# Patient Record
Sex: Female | Born: 1955
Health system: Southern US, Community
[De-identification: ages and names within clinical notes are randomized; demographics above are authoritative.]

## PROBLEM LIST (undated history)

## (undated) DIAGNOSIS — J301 Allergic rhinitis due to pollen: Secondary | ICD-10-CM

## (undated) DIAGNOSIS — Z8371 Family history of colonic polyps: Secondary | ICD-10-CM

## (undated) DIAGNOSIS — R51 Headache: Secondary | ICD-10-CM

## (undated) DIAGNOSIS — N390 Urinary tract infection, site not specified: Secondary | ICD-10-CM

## (undated) DIAGNOSIS — K219 Gastro-esophageal reflux disease without esophagitis: Secondary | ICD-10-CM

## (undated) DIAGNOSIS — Z83719 Family history of colon polyps, unspecified: Secondary | ICD-10-CM

## (undated) DIAGNOSIS — R519 Headache, unspecified: Secondary | ICD-10-CM

## (undated) DIAGNOSIS — D219 Benign neoplasm of connective and other soft tissue, unspecified: Secondary | ICD-10-CM

## (undated) HISTORY — DX: Benign neoplasm of connective and other soft tissue, unspecified: D21.9

## (undated) HISTORY — DX: Headache, unspecified: R51.9

## (undated) HISTORY — DX: Urinary tract infection, site not specified: N39.0

## (undated) HISTORY — DX: Family history of colonic polyps: Z83.71

## (undated) HISTORY — DX: Allergic rhinitis due to pollen: J30.1

## (undated) HISTORY — PX: OTHER SURGICAL HISTORY: SHX169

## (undated) HISTORY — DX: Gastro-esophageal reflux disease without esophagitis: K21.9

## (undated) HISTORY — DX: Family history of colon polyps, unspecified: Z83.719

## (undated) HISTORY — DX: Headache: R51

---

## 2016-08-17 DIAGNOSIS — Z1231 Encounter for screening mammogram for malignant neoplasm of breast: Secondary | ICD-10-CM | POA: Diagnosis not present

## 2017-11-13 ENCOUNTER — Ambulatory Visit: Payer: BLUE CROSS/BLUE SHIELD | Admitting: Family Medicine

## 2017-11-13 ENCOUNTER — Encounter: Payer: Self-pay | Admitting: Family Medicine

## 2017-11-13 VITALS — BP 130/72 | HR 85 | Temp 98.0°F | Resp 12 | Ht 67.5 in | Wt 155.4 lb

## 2017-11-13 DIAGNOSIS — N3281 Overactive bladder: Secondary | ICD-10-CM

## 2017-11-13 DIAGNOSIS — I10 Essential (primary) hypertension: Secondary | ICD-10-CM | POA: Diagnosis not present

## 2017-11-13 DIAGNOSIS — R350 Frequency of micturition: Secondary | ICD-10-CM | POA: Diagnosis not present

## 2017-11-13 LAB — URINALYSIS, ROUTINE W REFLEX MICROSCOPIC
Bilirubin Urine: NEGATIVE
Ketones, ur: NEGATIVE
Nitrite: POSITIVE — AB
RBC / HPF: NONE SEEN (ref 0–?)
Specific Gravity, Urine: <=1.005 — AB (ref 1.000–1.030)
Total Protein, Urine: NEGATIVE
Urine Glucose: NEGATIVE
Urobilinogen, UA: 0.2 (ref 0.0–1.0)
pH: 6.5 (ref 5.0–8.0)

## 2017-11-13 MED ORDER — MIRABEGRON ER 25 MG PO TB24: 25.0000 mg | ORAL_TABLET | Freq: Every day | ORAL | 1 refills | Status: DC

## 2017-11-13 NOTE — Patient Instructions (Signed)
A few things to remember from today's visit:   Urinary frequency - Plan: Urinalysis, Routine w reflex microscopic  Overactive bladder   Overactive Bladder, Adult Overactive bladder is a group of urinary symptoms. With overactive bladder, you may suddenly feel the need to pass urine (urinate) right away. After feeling this sudden urge, you might also leak urine if you cannot get to the bathroom fast enough (urinary incontinence). These symptoms might interfere with your daily work or social activities. Overactive bladder symptoms may also wake you up at night. Overactive bladder affects the nerve signals between your bladder and your brain. Your bladder may get the signal to empty before it is full. Very sensitive muscles can also make your bladder squeeze too soon. What are the causes? Many things can cause an overactive bladder. Possible causes include:  Urinary tract infection.  Infection of nearby tissues, such as the prostate.  Prostate enlargement.  Being pregnant with twins or more (multiples).  Surgery on the uterus or urethra.  Bladder stones, inflammation, or tumors.  Drinking too much caffeine or alcohol.  Certain medicines, especially those that you take to help your body get rid of extra fluid (diuretics) by increasing urine production.  Muscle or nerve weakness, especially from: ? A spinal cord injury. ? Stroke. ? Multiple sclerosis. ? Parkinson disease.  Diabetes. This can cause a high urine volume that fills the bladder so quickly that the normal urge to urinate is triggered very strongly.  Constipation. A buildup of too much stool can put pressure on your bladder.  What increases the risk? You may be at greater risk for overactive bladder if you:  Are an older adult.  Smoke.  Are going through menopause.  Have prostate problems.  Have a neurological disease, such as stroke, dementia, Parkinson disease, or multiple sclerosis (MS).  Eat or drink  things that irritate the bladder. These include alcohol, spicy food, and caffeine.  Are overweight or obese.  What are the signs or symptoms? The signs and symptoms of an overactive bladder include:  Sudden, strong urges to urinate.  Leaking urine.  Urinating eight or more times per day.  Waking up to urinate two or more times per night.  How is this diagnosed? Your health care provider may suspect overactive bladder based on your symptoms. The health care provider will do a physical exam and take your medical history. Blood or urine tests may also be done. For example, you might need to have a bladder function test to check how well you can hold your urine. You might also need to see a health care provider who specializes in the urinary tract (urologist). How is this treated? Treatment for overactive bladder depends on the cause of your condition and whether it is mild or severe. Certain treatments can be done in your health care provider's office or clinic. You can also make lifestyle changes at home. Options include: Behavioral Treatments  Biofeedback. A specialist uses sensors to help you become aware of your body's signals.  Keeping a daily log of when you need to urinate and what happens after the urge. This may help you manage your condition.  Bladder training. This helps you learn to control the urge to urinate by following a schedule that directs you to urinate at regular intervals (timed voiding). At first, you might have to wait a few minutes after feeling the urge. In time, you should be able to schedule bathroom visits an hour or more apart.  Kegel exercises.  These are exercises to strengthen the pelvic floor muscles, which support the bladder. Toning these muscles can help you control urination, even if your bladder muscles are overactive. A specialist will teach you how to do these exercises correctly. They require daily practice.  Weight loss. If you are obese or  overweight, losing weight might relieve your symptoms of overactive bladder. Talk to your health care provider about losing weight and whether there is a specific program or method that would work best for you.  Diet change. This might help if constipation is making your overactive bladder worse. Your health care provider or a dietitian can explain ways to change what you eat to ease constipation. You might also need to consume less alcohol and caffeine or drink other fluids at different times of the day.  Stopping smoking.  Wearing pads to absorb leakage while you wait for other treatments to take effect. Physical Treatments  Electrical stimulation. Electrodes send gentle pulses of electricity to strengthen the nerves or muscles that help to control the bladder. Sometimes, the electrodes are placed outside of the body. In other cases, they might be placed inside the body (implanted). This treatment can take several months to have an effect.  Supportive devices. Women may need a plastic device that fits into the vagina and supports the bladder (pessary). Medicines Several medicines can help treat overactive bladder and are usually used along with other treatments. Some are injected into the muscles involved in urination. Others come in pill form. Your health care provider may prescribe:  Antispasmodics. These medicines block the signals that the nerves send to the bladder. This keeps the bladder from releasing urine at the wrong time.  Tricyclic antidepressants. These types of antidepressants also relax bladder muscles.  Surgery  You may have a device implanted to help manage the nerve signals that indicate when you need to urinate.  You may have surgery to implant electrodes for electrical stimulation.  Sometimes, very severe cases of overactive bladder require surgery to change the shape of the bladder. Follow these instructions at home:  Take medicines only as directed by your health  care provider.  Use any implants or a pessary as directed by your health care provider.  Make any diet or lifestyle changes that are recommended by your health care provider. These might include: ? Drinking less fluid or drinking at different times of the day. If you need to urinate often during the night, you may need to stop drinking fluids early in the evening. ? Cutting down on caffeine or alcohol. Both can make an overactive bladder worse. Caffeine is found in coffee, tea, and sodas. ? Doing Kegel exercises to strengthen muscles. ? Losing weight if you need to. ? Eating a healthy and balanced diet to prevent constipation.  Keep a journal or log to track how much and when you drink and also when you feel the need to urinate. This will help your health care provider to monitor your condition. Contact a health care provider if:  Your symptoms do not get better after treatment.  Your pain and discomfort are getting worse.  You have more frequent urges to urinate.  You have a fever. Get help right away if: You are not able to control your bladder at all. This information is not intended to replace advice given to you by your health care provider. Make sure you discuss any questions you have with your health care provider. Document Released: 07/29/2009 Document Revised: 03/09/2016 Document Reviewed: 02/25/2014  Elsevier Interactive Patient Education  [employer]. Please be sure medication list is accurate. If a new problem present, please set up appointment sooner than planned today.

## 2017-11-13 NOTE — Progress Notes (Signed)
HPI:   Tiffany Duran is a 62 y.o. female, who is here today to establish care with me.  Former PCP: Dr [employer].  She moved from [place], [place] a couple years ago. Last preventive routine visit: 2016.  Chronic medical problems: Recurrent UTI,PVC's,HTN, and contact dermatitis among some.  HTN: Denies severe/frequent headache, visual changes, chest pain, dyspnea, palpitation, claudication, focal weakness, or edema. Currently she is on HCTZ 25 mg daily.   Concerns today: Urine odor and frequency.   Concerned about urinary frequency that lasted 4 months. Occasionally dark and odorous urine.  Urgency and occasional incontinence.  She is sexually active.  Denies dysuria, gross hematuria,or decreased urine output. She has not noted vaginal bleeding or discharge. No fever,chills,abdominal pain,or abnormal wt loss.    Review of Systems  Constitutional: Negative for activity change, appetite change, fatigue and fever.  HENT: Negative for mouth sores, nosebleeds and sore throat.   Eyes: Negative for redness and visual disturbance.  Gastrointestinal: Negative for abdominal pain, constipation, diarrhea, nausea and vomiting.  Endocrine: Negative for cold intolerance and heat intolerance.  Genitourinary: Positive for frequency and urgency. Negative for decreased urine volume, dysuria, hematuria, vaginal bleeding and vaginal discharge.  Musculoskeletal: Negative for gait problem and myalgias.  Skin: Negative for rash and wound.  Neurological: Negative for syncope, weakness and headaches.  Hematological: Negative for adenopathy. Does not bruise/bleed easily.  Psychiatric/Behavioral: Negative for confusion and sleep disturbance.      No current outpatient medications on file prior to visit.   No current facility-administered medications on file prior to visit.      Past Medical History:  Diagnosis Date  . Family history of polyps in the colon   . Frequent  headaches   . GERD (gastroesophageal reflux disease)   . Hay fever   . UTI (urinary tract infection)    No Known Allergies  Family History  Problem Relation Age of Onset  . Early death Father   . Hypertension Mother   . Hypertension Brother     Social History   Socioeconomic History  . Marital status: Married    Spouse name: None  . Number of children: None  . Years of education: None  . Highest education level: None  Social Needs  . Financial resource strain: None  . Food insecurity - worry: None  . Food insecurity - inability: None  . Transportation needs - medical: None  . Transportation needs - non-medical: None  Occupational History  . None  Tobacco Use  . Smoking status: Never Smoker  . Smokeless tobacco: Never Used  Substance and Sexual Activity  . Alcohol use: Yes  . Drug use: No  . Sexual activity: None  Other Topics Concern  . None  Social History Narrative  . None    Vitals:   11/13/17 0807  BP: 130/72  Pulse: 85  Resp: 12  Temp: 98 F (36.7 C)  SpO2: 99%    Body mass index is 23.98 kg/m.   Physical Exam  Nursing note and vitals reviewed. Constitutional: She is oriented to person, place, and time. She appears well-developed and well-nourished. No distress.  HENT:  Head: Normocephalic and atraumatic.  Mouth/Throat: Oropharynx is clear and moist and mucous membranes are normal.  Eyes: Conjunctivae are normal. Pupils are equal, round, and reactive to light.  Cardiovascular: Normal rate and regular rhythm.  No murmur heard. Pulses:      Dorsalis pedis pulses are 2+ on the right side,  and 2+ on the left side.  Respiratory: Effort normal and breath sounds normal. No respiratory distress.  GI: Soft. She exhibits no mass. There is no tenderness. There is no CVA tenderness.  Musculoskeletal: She exhibits no edema or tenderness.  Lymphadenopathy:    She has no cervical adenopathy.  Neurological: She is alert and oriented to person, place, and  time. She has normal strength. Gait normal.  Skin: Skin is warm. No rash noted. No erythema.  Psychiatric: Her speech is normal. Her mood appears anxious.  Well groomed,good eye contact.      ASSESSMENT AND PLAN:  Tiffany Duran was seen today for establish care.  Diagnoses and all orders for this visit:  Urinary frequency  Possible etiologies discussed: Overactive bladder,IC,UTI, and other bladder abnormalities among some. Further recommendations will be given according to U/A and Ucx results.  -     Urinalysis, Routine w reflex microscopic -     Culture, Urine  Overactive bladder  After discussion of Sx and treatment options,she agrees with trying Myrbetriq 25 mg,side effects discussed. She will let me know in 6-8 weeks how she is doing with medication. F/U in 4 months.  -     mirabegron ER (MYRBETRIQ) 25 MG TB24 tablet; Take 1 tablet (25 mg total) by mouth daily.  Hypertension, essential, benign  Adequately controlled. No changes in current management. DASH diet recommended. Eye exam recommended annually. F/U in 4 months.       Wahid Holley G. Martinique, MD  [facility]. [place] office.

## 2017-11-16 LAB — URINE CULTURE
MICRO NUMBER:: 90122791
SPECIMEN QUALITY:: ADEQUATE

## 2017-11-20 ENCOUNTER — Other Ambulatory Visit: Payer: Self-pay | Admitting: *Deleted

## 2017-11-20 MED ORDER — NITROFURANTOIN MONOHYD MACRO 100 MG PO CAPS: 100.0000 mg | ORAL_CAPSULE | Freq: Two times a day (BID) | ORAL | 0 refills | Status: AC

## 2018-01-06 ENCOUNTER — Other Ambulatory Visit: Payer: Self-pay | Admitting: Family Medicine

## 2018-01-06 DIAGNOSIS — N3281 Overactive bladder: Secondary | ICD-10-CM

## 2018-01-07 ENCOUNTER — Telehealth: Payer: Self-pay | Admitting: Family Medicine

## 2018-01-07 NOTE — Telephone Encounter (Signed)
Copied from [place] [id number]. Topic: Inquiry >> Jan 07, 2018 11:20 AM Neva Seat wrote: Pt is new to the area and is asking for a recommendation from Dr. Martinique to a Gynecologist. Please call pt w/ doctor recommended.

## 2018-01-07 NOTE — Telephone Encounter (Signed)
Options: Dr Ulanda Edison, and Dr Nelda Marseille.  Thanks, BJ

## 2018-01-08 NOTE — Telephone Encounter (Signed)
Left message to give clinic a call back. 

## 2018-01-08 NOTE — Telephone Encounter (Signed)
Spoke with patient, gave recommendations per Dr. Martinique.

## 2018-01-15 ENCOUNTER — Other Ambulatory Visit: Payer: Self-pay | Admitting: *Deleted

## 2018-01-15 DIAGNOSIS — N3281 Overactive bladder: Secondary | ICD-10-CM

## 2018-01-15 MED ORDER — MIRABEGRON ER 25 MG PO TB24: 25.0000 mg | ORAL_TABLET | Freq: Every day | ORAL | 2 refills | Status: DC

## 2018-01-22 ENCOUNTER — Other Ambulatory Visit (HOSPITAL_COMMUNITY)
Admission: RE | Admit: 2018-01-22 | Discharge: 2018-01-22 | Disposition: A | Discharge status: Home / Self Care | Payer: BLUE CROSS/BLUE SHIELD | Source: Ambulatory Visit | Attending: Obstetrics and Gynecology | Admitting: Obstetrics and Gynecology

## 2018-01-22 ENCOUNTER — Ambulatory Visit (INDEPENDENT_AMBULATORY_CARE_PROVIDER_SITE_OTHER): Payer: BLUE CROSS/BLUE SHIELD | Admitting: Obstetrics and Gynecology

## 2018-01-22 ENCOUNTER — Other Ambulatory Visit: Payer: Self-pay

## 2018-01-22 ENCOUNTER — Encounter: Payer: Self-pay | Admitting: Obstetrics and Gynecology

## 2018-01-22 VITALS — BP 130/82 | HR 80 | Resp 14 | Ht 67.0 in | Wt 158.0 lb

## 2018-01-22 DIAGNOSIS — Z124 Encounter for screening for malignant neoplasm of cervix: Secondary | ICD-10-CM

## 2018-01-22 DIAGNOSIS — Z0000 Encounter for general adult medical examination without abnormal findings: Secondary | ICD-10-CM

## 2018-01-22 DIAGNOSIS — R7309 Other abnormal glucose: Secondary | ICD-10-CM | POA: Diagnosis not present

## 2018-01-22 DIAGNOSIS — Z01419 Encounter for gynecological examination (general) (routine) without abnormal findings: Secondary | ICD-10-CM

## 2018-01-22 DIAGNOSIS — E559 Vitamin D deficiency, unspecified: Secondary | ICD-10-CM

## 2018-01-22 DIAGNOSIS — N952 Postmenopausal atrophic vaginitis: Secondary | ICD-10-CM | POA: Diagnosis not present

## 2018-01-22 DIAGNOSIS — N941 Unspecified dyspareunia: Secondary | ICD-10-CM | POA: Diagnosis not present

## 2018-01-22 NOTE — Patient Instructions (Signed)
EXERCISE AND DIET:  We recommended that you start or continue a regular exercise program for good health. Regular exercise means any activity that makes your heart beat faster and makes you sweat.  We recommend exercising at least 30 minutes per day at least 3 days a week, preferably 4 or 5.  We also recommend a diet low in fat and sugar.  Inactivity, poor dietary choices and obesity can cause diabetes, heart attack, stroke, and kidney damage, among others.    ALCOHOL AND SMOKING:  Women should limit their alcohol intake to no more than 7 drinks/beers/glasses of wine (combined, not each!) per week. Moderation of alcohol intake to this level decreases your risk of breast cancer and liver damage. And of course, no recreational drugs are part of a healthy lifestyle.  And absolutely no smoking or even second hand smoke. Most people know smoking can cause heart and lung diseases, but did you know it also contributes to weakening of your bones? Aging of your skin?  Yellowing of your teeth and nails?  CALCIUM AND VITAMIN D:  Adequate intake of calcium and Vitamin D are recommended.  The recommendations for exact amounts of these supplements seem to change often, but generally speaking 600 mg of calcium (either carbonate or citrate) and 800 units of Vitamin D per day seems prudent. Certain women may benefit from higher intake of Vitamin D.  If you are among these women, your doctor will have told you during your visit.    You should be getting 1,200 mg of calcium a day between your diet and supplements.  PAP SMEARS:  Pap smears, to check for cervical cancer or precancers,  have traditionally been done yearly, although recent scientific advances have shown that most women can have pap smears less often.  However, every woman still should have a physical exam from her gynecologist every year. It will include a breast check, inspection of the vulva and vagina to check for abnormal growths or skin changes, a visual exam  of the cervix, and then an exam to evaluate the size and shape of the uterus and ovaries.  And after 61 years of age, a rectal exam is indicated to check for rectal cancers. We will also provide age appropriate advice regarding health maintenance, like when you should have certain vaccines, screening for sexually transmitted diseases, bone density testing, colonoscopy, mammograms, etc.   MAMMOGRAMS:  All women over 61 years old should have a yearly mammogram. Many facilities now offer a "3D" mammogram, which may cost around $50 extra out of pocket. If possible,  we recommend you accept the option to have the 3D mammogram performed.  It both reduces the number of women who will be called back for extra views which then turn out to be normal, and it is better than the routine mammogram at detecting truly abnormal areas.    COLONOSCOPY:  Colonoscopy to screen for colon cancer is recommended for all women at age 61.  We know, you hate the idea of the prep.  We agree, BUT, having colon cancer and not knowing it is worse!!  Colon cancer so often starts as a polyp that can be seen and removed at colonscopy, which can quite literally save your life!  And if your first colonoscopy is normal and you have no family history of colon cancer, most women don't have to have it again for 10 years.  Once every ten years, you can do something that may end up saving your life,  right?  We will be happy to help you get it scheduled when you are ready.  Be sure to check your insurance coverage so you understand how much it will cost.  It may be covered as a preventative service at no cost, but you should check your particular policy.      Breast Self-Awareness Breast self-awareness means being familiar with how your breasts look and feel. It involves checking your breasts regularly and reporting any changes to your health care provider. Practicing breast self-awareness is important. A change in your breasts can be a sign of a  serious medical problem. Being familiar with how your breasts look and feel allows you to find any problems early, when treatment is more likely to be successful. All women should practice breast self-awareness, including women who have had breast implants. How to do a breast self-exam One way to learn what is normal for your breasts and whether your breasts are changing is to do a breast self-exam. To do a breast self-exam: Look for Changes  1. Remove all the clothing above your waist. 2. Stand in front of a mirror in a room with good lighting. 3. Put your hands on your hips. 4. Push your hands firmly downward. 5. Compare your breasts in the mirror. Look for differences between them (asymmetry), such as: ? Differences in shape. ? Differences in size. ? Puckers, dips, and bumps in one breast and not the other. 6. Look at each breast for changes in your skin, such as: ? Redness. ? Scaly areas. 7. Look for changes in your nipples, such as: ? Discharge. ? Bleeding. ? Dimpling. ? Redness. ? A change in position. Feel for Changes  Carefully feel your breasts for lumps and changes. It is best to do this while lying on your back on the floor and again while sitting or standing in the shower or tub with soapy water on your skin. Feel each breast in the following way:  Place the arm on the side of the breast you are examining above your head.  Feel your breast with the other hand.  Start in the nipple area and make  inch (2 cm) overlapping circles to feel your breast. Use the pads of your three middle fingers to do this. Apply light pressure, then medium pressure, then firm pressure. The light pressure will allow you to feel the tissue closest to the skin. The medium pressure will allow you to feel the tissue that is a little deeper. The firm pressure will allow you to feel the tissue close to the ribs.  Continue the overlapping circles, moving downward over the breast until you feel your  ribs below your breast.  Move one finger-width toward the center of the body. Continue to use the  inch (2 cm) overlapping circles to feel your breast as you move slowly up toward your collarbone.  Continue the up and down exam using all three pressures until you reach your armpit.  Write Down What You Find  Write down what is normal for each breast and any changes that you find. Keep a written record with breast changes or normal findings for each breast. By writing this information down, you do not need to depend only on memory for size, tenderness, or location. Write down where you are in your menstrual cycle, if you are still menstruating. If you are having trouble noticing differences in your breasts, do not get discouraged. With time you will become more familiar with the variations  in your breasts and more comfortable with the exam. How often should I examine my breasts? Examine your breasts every month. If you are breastfeeding, the best time to examine your breasts is after a feeding or after using a breast pump. If you menstruate, the best time to examine your breasts is 5-7 days after your period is over. During your period, your breasts are lumpier, and it may be more difficult to notice changes. When should I see my health care provider? See your health care provider if you notice:  A change in shape or size of your breasts or nipples.  A change in the skin of your breast or nipples, such as a reddened or scaly area.  Unusual discharge from your nipples.  A lump or thick area that was not there before.  Pain in your breasts.  Anything that concerns you.  This information is not intended to replace advice given to you by your health care provider. Make sure you discuss any questions you have with your health care provider. Document Released: 10/02/2005 Document Revised: 03/09/2016 Document Reviewed: 08/22/2015 Elsevier Interactive Patient Education  [employer].

## 2018-01-22 NOTE — Progress Notes (Signed)
62 y.o. [id number] MarriedCaucasianF here for annual exam. No vaginal bleeding. Sexually active, some entry dyspareunia. Just started using lubricant, it is helping some. Intermittent deep dyspareunia.     She just started on myrbetriq for urinary urgency, no leakage. It is helping.     No LMP recorded. Patient is postmenopausal.          Sexually active: Yes.    The current method of family planning is post menopausal status.    Exercising: No.  The patient does not participate in regular exercise at present. Smoker:  no  Health Maintenance: Pap:  2017 WNL per patient  History of abnormal Pap:  no MMG:  08/2016 WNL [facility] (care everywhere)  Colonoscopy:  03/2011 polyps repeat in 10 yrs BMD:   04/2008 Normal TDaP:  12-11-11 Gardasil: N/A   reports that she has never smoked. She has never used smokeless tobacco. She reports that she drinks about 1.2 - 1.8 oz of alcohol per week. She reports that she does not use drugs. Works as an Web designer, works from home.  4 daughters, all out of state. 4 grand children.   Past Medical History:  Diagnosis Date  . Family history of polyps in the colon   . Frequent headaches   . GERD (gastroesophageal reflux disease)   . Hay fever   . UTI (urinary tract infection)     Past Surgical History:  Procedure Laterality Date  .  ablation uterine      Current Outpatient Medications  Medication Sig Dispense Refill  . mirabegron ER (MYRBETRIQ) 25 MG TB24 tablet Take 1 tablet (25 mg total) by mouth daily. 90 tablet 2   No current facility-administered medications for this visit.     Family History  Problem Relation Age of Onset  . Early death Father   . Hypertension Mother   . Hypertension Brother   Father died of bone cancer at 32  Review of Systems  Constitutional: Negative.   HENT: Negative.   Eyes: Negative.   Respiratory: Negative.   Cardiovascular: Negative.   Gastrointestinal: Negative.   Endocrine: Negative.    Genitourinary: Positive for dyspareunia.  Musculoskeletal: Negative.   Skin: Negative.   Allergic/Immunologic: Negative.   Neurological: Negative.   Psychiatric/Behavioral: Negative.     Exam:   BP 130/82 (BP Location: Right Arm, Patient Position: Sitting, Cuff Size: Normal)   Pulse 80   Resp 14   Ht 5\' 7"  (1.702 m)   Wt 158 lb (71.7 kg)   BMI 24.75 kg/m   Weight change: @WEIGHTCHANGE @ Height:   Height: 5\' 7"  (170.2 cm)  Ht Readings from Last 3 Encounters:  01/22/18 5\' 7"  (1.702 m)  11/13/17 5' 7.5" (1.715 m)    General appearance: alert, cooperative and appears stated age Head: Normocephalic, without obvious abnormality, atraumatic Neck: no adenopathy, supple, symmetrical, trachea midline and thyroid normal to inspection and palpation Lungs: clear to auscultation bilaterally Cardiovascular: regular rate and rhythm Breasts: normal appearance, no masses or tenderness Abdomen: soft, non-tender; non distended,  no masses,  no organomegaly Extremities: extremities normal, atraumatic, no cyanosis or edema Skin: Skin color, texture, turgor normal. No rashes or lesions Lymph nodes: Cervical, supraclavicular, and axillary nodes normal. No abnormal inguinal nodes palpated Neurologic: Grossly normal   Pelvic: External genitalia:  no lesions              Urethra:  normal appearing urethra with no masses, tenderness or lesions  Bartholins and Skenes: normal                 Vagina: atrophic appearing vagina with normal color and discharge, no lesions              Cervix: no lesions               Bimanual Exam:  Uterus:  normal size, contour, position, consistency, mobility, non-tender              Adnexa: no mass, fullness, tenderness               Rectovaginal: Confirms               Anus:  normal sphincter tone, no lesions  Chaperone was present for exam.  A:  Well Woman with normal exam  Was being followed by prior MD for borderline BP  Vit d def in her  diet  Mild dyspareunia, just started using a lubricant  Vaginal atrophy  P:   Pap with hpv  Discussed breast self exam  Discussed calcium and vit D intake  Vit D  Screening labs (fasting)  Mammogram  Colonoscopy UTD  We discussed the option of vaginal estrogen, she will call if she wants to try it

## 2018-01-23 ENCOUNTER — Telehealth: Payer: Self-pay | Admitting: *Deleted

## 2018-01-23 LAB — COMPREHENSIVE METABOLIC PANEL
ALT: 13 IU/L (ref 0–32)
AST: 15 IU/L (ref 0–40)
Albumin/Globulin Ratio: 1.9 (ref 1.2–2.2)
Albumin: 4.6 g/dL (ref 3.6–4.8)
Alkaline Phosphatase: 80 IU/L (ref 39–117)
BUN/Creatinine Ratio: 16 (ref 12–28)
BUN: 12 mg/dL (ref 8–27)
Bilirubin Total: 0.5 mg/dL (ref 0.0–1.2)
CO2: 28 mmol/L (ref 20–29)
Calcium: 9.7 mg/dL (ref 8.7–10.3)
Chloride: 99 mmol/L (ref 96–106)
Creatinine, Ser: 0.74 mg/dL (ref 0.57–1.00)
GFR calc Af Amer: 102 mL/min/{1.73_m2} (ref >59)
GFR calc non Af Amer: 88 mL/min/{1.73_m2} (ref >59)
Globulin, Total: 2.4 g/dL (ref 1.5–4.5)
Glucose: 100 mg/dL — ABNORMAL HIGH (ref 65–99)
Potassium: 4.2 mmol/L (ref 3.5–5.2)
Sodium: 142 mmol/L (ref 134–144)
Total Protein: 7 g/dL (ref 6.0–8.5)

## 2018-01-23 LAB — LIPID PANEL
Chol/HDL Ratio: 3.9 ratio (ref 0.0–4.4)
Cholesterol, Total: 225 mg/dL — ABNORMAL HIGH (ref 100–199)
HDL: 57 mg/dL (ref >39)
LDL Calculated: 142 mg/dL — ABNORMAL HIGH (ref 0–99)
Triglycerides: 128 mg/dL (ref 0–149)
VLDL Cholesterol Cal: 26 mg/dL (ref 5–40)

## 2018-01-23 LAB — CBC
Hematocrit: 42.2 % (ref 34.0–46.6)
Hemoglobin: 13.8 g/dL (ref 11.1–15.9)
MCH: 29.1 pg (ref 26.6–33.0)
MCHC: 32.7 g/dL (ref 31.5–35.7)
MCV: 89 fL (ref 79–97)
Platelets: 233 10*3/uL (ref 150–379)
RBC: 4.74 x10E6/uL (ref 3.77–5.28)
RDW: 13 % (ref 12.3–15.4)
WBC: 6.4 10*3/uL (ref 3.4–10.8)

## 2018-01-23 LAB — CYTOLOGY - PAP
Diagnosis: NEGATIVE
HPV: NOT DETECTED

## 2018-01-23 LAB — VITAMIN D 25 HYDROXY (VIT D DEFICIENCY, FRACTURES): Vit D, 25-Hydroxy: 12.3 ng/mL — ABNORMAL LOW (ref 30.0–100.0)

## 2018-01-23 MED ORDER — VITAMIN D (ERGOCALCIFEROL) 1.25 MG (50000 UNIT) PO CAPS: 50000.0000 [IU] | ORAL_CAPSULE | ORAL | 0 refills | Status: DC

## 2018-01-23 NOTE — Telephone Encounter (Signed)
Returned patients call- left another message to call back -eh

## 2018-01-23 NOTE — Telephone Encounter (Signed)
Return call to [place].

## 2018-01-23 NOTE — Telephone Encounter (Signed)
Left message to call for lab results-eh

## 2018-01-23 NOTE — Telephone Encounter (Signed)
Spoke with patient and gave results and recommendations. Added hgbA1c. RX for vitamin D sent in and scheduled for 12 week recheck -eh

## 2018-01-23 NOTE — Telephone Encounter (Signed)
-----   Message from Salvadore Dom, MD sent at 01/23/2018 12:49 PM EDT ----- Her glucose was just over normal. Please add a hgbA1c onto her blood work from yesterday. Her vit d was very low. Please call in 50,000 IU of vit d 3, 1 po qweek x 12 weeks, then she should have her vit d rechecked. Her total cholesterol and LDL were slightly elevated. The rest of her lipid panel was normal.  I would recommend a mediterranean diet and regular exercise.  A mediterranean diet is high in fruits, vegetables, whole grains, fish, chicken, nuts, healthy fats (olive oil or canola oil). Low fat dairy. Limit butter, margarine, red meat and sweets.  The rest of her lab work was normal. 02 recall

## 2018-01-23 NOTE — Telephone Encounter (Signed)
Return call to [place].

## 2018-02-01 LAB — SPECIMEN STATUS REPORT

## 2018-02-01 LAB — HGB A1C W/O EAG: Hgb A1c MFr Bld: 5.6 % (ref 4.8–5.6)

## 2018-03-08 ENCOUNTER — Other Ambulatory Visit: Payer: Self-pay | Admitting: Family Medicine

## 2018-03-08 DIAGNOSIS — Z1231 Encounter for screening mammogram for malignant neoplasm of breast: Secondary | ICD-10-CM

## 2018-03-12 ENCOUNTER — Ambulatory Visit: Payer: BLUE CROSS/BLUE SHIELD | Admitting: Family Medicine

## 2018-03-12 ENCOUNTER — Encounter: Payer: Self-pay | Admitting: Family Medicine

## 2018-03-12 VITALS — BP 124/83 | HR 72 | Temp 97.9°F | Resp 12 | Ht 67.0 in | Wt 163.2 lb

## 2018-03-12 DIAGNOSIS — I10 Essential (primary) hypertension: Secondary | ICD-10-CM | POA: Diagnosis not present

## 2018-03-12 DIAGNOSIS — N3281 Overactive bladder: Secondary | ICD-10-CM | POA: Diagnosis not present

## 2018-03-12 DIAGNOSIS — E785 Hyperlipidemia, unspecified: Secondary | ICD-10-CM

## 2018-03-12 MED ORDER — MIRABEGRON ER 50 MG PO TB24
50.0000 mg | ORAL_TABLET | Freq: Every day | ORAL | 3 refills | Status: DC
Start: 2018-03-12 — End: 2019-03-14

## 2018-03-12 NOTE — Progress Notes (Signed)
HPI:   Tiffany Duran is a 62 y.o. female, who is here today for 4 months follow up.   She was last seen on 11/13/2017, when she was complaining of urinary frequency, urgency and occasional urine incontinence. Ucx grew E. coli, she completed treatment with Macrobid.  Myrbetriq 25 mg was started last visit, she has tolerated medication well and symptoms have improved but not resolved.    Since her last OV she has seen her gyn,had labs done in 01/2018.  Diagnosed with vitamin D deficiency, currently she is on ergocalciferol 50,000 units weekly.   Hypertension:   Currently on non pharmacologic treatment.  She was prescribed HCTZ by former PCP,did not take it.  She is doing very well low-salt diet.   Negative for unusual headache, visual changes, exertional chest pain, dyspnea,  focal weakness, or edema.   Lab Results  Component Value Date   CREATININE 0.74 01/22/2018   BUN 12 01/22/2018   NA 142 01/22/2018   K 4.2 01/22/2018   CL 99 01/22/2018   CO2 28 01/22/2018     HLD: She is on nonpharmacologic treatment.  She has not been on medication before.  Lab Results  Component Value Date   CHOL 225 (H) 01/22/2018   HDL 57 01/22/2018   LDLCALC 142 (H) 01/22/2018   TRIG 128 01/22/2018   CHOLHDL 3.9 01/22/2018     Review of Systems  Constitutional: Negative for activity change, appetite change, fatigue and fever.  HENT: Negative for mouth sores, nosebleeds and trouble swallowing.   Eyes: Negative for redness and visual disturbance.  Respiratory: Negative for cough, shortness of breath and wheezing.   Cardiovascular: Negative for chest pain, palpitations and leg swelling.  Gastrointestinal: Negative for abdominal pain, nausea and vomiting.       Negative for changes in bowel habits.  Genitourinary: Positive for frequency. Negative for decreased urine volume, dysuria and hematuria.  Musculoskeletal: Negative for gait problem and myalgias.    Neurological: Negative for syncope, weakness and headaches.      Current Outpatient Medications on File Prior to Visit  Medication Sig Dispense Refill  . Vitamin D, Ergocalciferol, (DRISDOL) 50000 units CAPS capsule Take 1 capsule (50,000 Units total) by mouth every 7 (seven) days. 12 capsule 0   No current facility-administered medications on file prior to visit.      Past Medical History:  Diagnosis Date  . Family history of polyps in the colon   . Fibroid   . Frequent headaches   . GERD (gastroesophageal reflux disease)   . Hay fever   . UTI (urinary tract infection)    Allergies  Allergen Reactions  . Latex Anxiety and Shortness Of Breath  . Amoxicillin Hives and Itching    Social History   Socioeconomic History  . Marital status: Married    Spouse name: Not on file  . Number of children: Not on file  . Years of education: Not on file  . Highest education level: Not on file  Occupational History  . Not on file  Social Needs  . Financial resource strain: Not on file  . Food insecurity:    Worry: Not on file    Inability: Not on file  . Transportation needs:    Medical: Not on file    Non-medical: Not on file  Tobacco Use  . Smoking status: Never Smoker  . Smokeless tobacco: Never Used  Substance and Sexual Activity  . Alcohol use: Yes  Alcohol/week: 1.2 - 1.8 oz    Types: 2 - 3 Standard drinks or equivalent per week  . Drug use: No  . Sexual activity: Yes    Partners: Male    Birth control/protection: Post-menopausal  Lifestyle  . Physical activity:    Days per week: Not on file    Minutes per session: Not on file  . Stress: Not on file  Relationships  . Social connections:    Talks on phone: Not on file    Gets together: Not on file    Attends religious service: Not on file    Active member of club or organization: Not on file    Attends meetings of clubs or organizations: Not on file    Relationship status: Not on file  Other Topics  Concern  . Not on file  Social History Narrative  . Not on file    Vitals:   03/12/18 0951  BP: 124/83  Pulse: 72  Resp: 12  Temp: 97.9 F (36.6 C)  SpO2: 97%   Body mass index is 25.57 kg/m.    Physical Exam  Nursing note and vitals reviewed. Constitutional: She is oriented to person, place, and time. She appears well-developed and well-nourished. No distress.  HENT:  Head: Normocephalic and atraumatic.  Mouth/Throat: Oropharynx is clear and moist and mucous membranes are normal.  Eyes: Pupils are equal, round, and reactive to light. Conjunctivae are normal.  Cardiovascular: Normal rate and regular rhythm.  No murmur heard. Pulses:      Dorsalis pedis pulses are 2+ on the right side, and 2+ on the left side.  Respiratory: Effort normal and breath sounds normal. No respiratory distress.  GI: Soft. She exhibits no mass. There is no hepatomegaly. There is no tenderness.  Musculoskeletal: She exhibits no edema or tenderness.  Lymphadenopathy:    She has no cervical adenopathy.  Neurological: She is alert and oriented to person, place, and time. She has normal strength. Gait normal.  Skin: Skin is warm. No erythema.  Psychiatric: She has a normal mood and affect.  Well groomed, good eye contact.     ASSESSMENT AND PLAN:   Ms. Mardel Grudzien Baudoin was seen today for 4 months follow-up.  No orders of the defined types were placed in this encounter.   Overactive bladder Symptoms improved, tolerating Myrbetriq 25 mg well. She agrees with increasing dose of Myrbetriq from 25 mg to 50 mg.   Anson Crofts and pelvic floor exercises recommended. She can continue following annually.  Hyperlipidemia 10 years ASCVD risk 3.7%, we discussed interpretation and current recommendations in regard to pharmacologic treatment. She will continue nonpharmacologic treatment. She can follow annually.  Hypertension, essential, benign BP adequately controlled today. We discussed current  recommendations in regard to hypertension treatment. Low-salt diet. Recommend monitoring BP at home regularly. She will continue nonpharmacologic treatment and can continue following annually, before if needed.    The 10-year ASCVD risk score Mikey Bussing DC Brooke Bonito., et al., 2013) is: 3.7%   Values used to calculate the score:     Age: 69 years     Sex: Female     Is Non-Hispanic African American: No     Diabetic: No     Tobacco smoker: No     Systolic Blood Pressure: 161 mmHg     Is BP treated: No     HDL Cholesterol: 57 mg/dL     Total Cholesterol: 225 mg/dL     Tallan Sandoz G. Martinique, MD  [facility].  [place] office.

## 2018-03-12 NOTE — Assessment & Plan Note (Signed)
10 years ASCVD risk 3.7%, we discussed interpretation and current recommendations in regard to pharmacologic treatment. She will continue nonpharmacologic treatment. She can follow annually.

## 2018-03-12 NOTE — Patient Instructions (Addendum)
A few things to remember from today's visit:   Hypertension, essential, benign  Overactive bladder - Plan: mirabegron ER (MYRBETRIQ) 50 MG TB24 tablet  Hyperlipidemia, unspecified hyperlipidemia type  Continue monitoring blood pressure at home. Low salt diet. Myrbetriq dose increased to 50 mg.  This exercise helps with mild urine leakage associated with cough, laughing, or sneezing. It may help with other types of urine incontinence and even with mild fecal incontinence.   Tighten and relax the pelvic muscles intermittently during the day. Once you are familiar with exercise try to hold pelvic muscles contraction for about 8-10 seconds.in the beginning you may not be able to hold contraction for more than a second or 2 but eventually you will be able to hold contraction harder and for longer time. Perform  8-12 exercises 3 times per day and daily for 15-20 weeks. You will need to continue exercises indefinitely to have a lasting effect.      Low fat diet.    The 10-year ASCVD risk score Mikey Bussing DC Brooke Bonito., et al., 2013) is: 3.7%   Values used to calculate the score:     Age: 62 years     Sex: Female     Is Non-Hispanic African American: No     Diabetic: No     Tobacco smoker: No     Systolic Blood Pressure: 098 mmHg     Is BP treated: No     HDL Cholesterol: 57 mg/dL     Total Cholesterol: 225 mg/dL   Please be sure medication list is accurate. If a new problem present, please set up appointment sooner than planned today.

## 2018-03-12 NOTE — Assessment & Plan Note (Signed)
Symptoms improved, tolerating Myrbetriq 25 mg well. She agrees with increasing dose of Myrbetriq from 25 mg to 50 mg.   Anson Crofts and pelvic floor exercises recommended. She can continue following annually.

## 2018-03-12 NOTE — Assessment & Plan Note (Signed)
BP adequately controlled today. We discussed current recommendations in regard to hypertension treatment. Low-salt diet. Recommend monitoring BP at home regularly. She will continue nonpharmacologic treatment and can continue following annually, before if needed.

## 2018-03-13 ENCOUNTER — Encounter: Payer: Self-pay | Admitting: Family Medicine

## 2018-03-29 ENCOUNTER — Ambulatory Visit
Admission: RE | Admit: 2018-03-29 | Discharge: 2018-03-29 | Disposition: A | Discharge status: Home / Self Care | Payer: BLUE CROSS/BLUE SHIELD | Source: Ambulatory Visit | Attending: Family Medicine | Admitting: Family Medicine

## 2018-03-29 DIAGNOSIS — Z1231 Encounter for screening mammogram for malignant neoplasm of breast: Secondary | ICD-10-CM | POA: Diagnosis not present

## 2018-04-08 DIAGNOSIS — M2022 Hallux rigidus, left foot: Secondary | ICD-10-CM | POA: Diagnosis not present

## 2018-04-08 DIAGNOSIS — M79672 Pain in left foot: Secondary | ICD-10-CM | POA: Diagnosis not present

## 2018-04-08 DIAGNOSIS — S90122A Contusion of left lesser toe(s) without damage to nail, initial encounter: Secondary | ICD-10-CM | POA: Diagnosis not present

## 2018-04-10 ENCOUNTER — Other Ambulatory Visit (INDEPENDENT_AMBULATORY_CARE_PROVIDER_SITE_OTHER): Payer: BLUE CROSS/BLUE SHIELD

## 2018-04-10 DIAGNOSIS — E559 Vitamin D deficiency, unspecified: Secondary | ICD-10-CM | POA: Diagnosis not present

## 2018-04-11 LAB — VITAMIN D 25 HYDROXY (VIT D DEFICIENCY, FRACTURES): Vit D, 25-Hydroxy: 31.7 ng/mL (ref 30.0–100.0)

## 2018-05-03 ENCOUNTER — Other Ambulatory Visit: Payer: Self-pay | Admitting: Obstetrics and Gynecology

## 2018-05-03 NOTE — Telephone Encounter (Signed)
She was advised via mychart to start taking 1,000 IU a day of vit D.  Refill not refilled.

## 2018-05-03 NOTE — Telephone Encounter (Signed)
Medication refill request: Vitamin D 50,000 Last AEX: 01/22/18 Next GRM:[id number] scheduled at this time  Last MMG (if hormonal medication request):  03/29/18 Birads category 1 neg Refill authorized: please refill if appropriate

## 2018-05-14 ENCOUNTER — Other Ambulatory Visit: Payer: Self-pay

## 2018-06-10 ENCOUNTER — Encounter (HOSPITAL_BASED_OUTPATIENT_CLINIC_OR_DEPARTMENT_OTHER): Payer: Self-pay

## 2018-06-10 ENCOUNTER — Ambulatory Visit (HOSPITAL_BASED_OUTPATIENT_CLINIC_OR_DEPARTMENT_OTHER): Admit: 2018-06-10 | Payer: BLUE CROSS/BLUE SHIELD | Admitting: Podiatry

## 2018-06-10 SURGERY — FUSION, INVOLVING TARSAL BONE
Anesthesia: General | Laterality: Left

## 2018-08-26 DIAGNOSIS — D225 Melanocytic nevi of trunk: Secondary | ICD-10-CM | POA: Diagnosis not present

## 2018-08-26 DIAGNOSIS — L814 Other melanin hyperpigmentation: Secondary | ICD-10-CM | POA: Diagnosis not present

## 2018-08-26 DIAGNOSIS — D2372 Other benign neoplasm of skin of left lower limb, including hip: Secondary | ICD-10-CM | POA: Diagnosis not present

## 2018-09-30 ENCOUNTER — Ambulatory Visit: Payer: BLUE CROSS/BLUE SHIELD | Admitting: Family Medicine

## 2018-09-30 DIAGNOSIS — Z23 Encounter for immunization: Secondary | ICD-10-CM | POA: Diagnosis not present

## 2018-09-30 DIAGNOSIS — L309 Dermatitis, unspecified: Secondary | ICD-10-CM | POA: Diagnosis not present

## 2019-03-14 ENCOUNTER — Telehealth: Payer: Self-pay | Admitting: Family Medicine

## 2019-03-14 ENCOUNTER — Ambulatory Visit (INDEPENDENT_AMBULATORY_CARE_PROVIDER_SITE_OTHER): Payer: BLUE CROSS/BLUE SHIELD | Admitting: Family Medicine

## 2019-03-14 ENCOUNTER — Encounter: Payer: Self-pay | Admitting: Family Medicine

## 2019-03-14 ENCOUNTER — Other Ambulatory Visit: Payer: Self-pay

## 2019-03-14 VITALS — BP 145/85 | HR 67 | Temp 97.9°F | Resp 12 | Ht 67.0 in | Wt 172.5 lb

## 2019-03-14 DIAGNOSIS — E559 Vitamin D deficiency, unspecified: Secondary | ICD-10-CM | POA: Diagnosis not present

## 2019-03-14 DIAGNOSIS — Z1329 Encounter for screening for other suspected endocrine disorder: Secondary | ICD-10-CM

## 2019-03-14 DIAGNOSIS — I10 Essential (primary) hypertension: Secondary | ICD-10-CM | POA: Diagnosis not present

## 2019-03-14 DIAGNOSIS — E785 Hyperlipidemia, unspecified: Secondary | ICD-10-CM | POA: Diagnosis not present

## 2019-03-14 DIAGNOSIS — Z13228 Encounter for screening for other metabolic disorders: Secondary | ICD-10-CM

## 2019-03-14 DIAGNOSIS — Z0000 Encounter for general adult medical examination without abnormal findings: Secondary | ICD-10-CM

## 2019-03-14 DIAGNOSIS — R059 Cough, unspecified: Secondary | ICD-10-CM

## 2019-03-14 DIAGNOSIS — R05 Cough: Secondary | ICD-10-CM

## 2019-03-14 DIAGNOSIS — Z0001 Encounter for general adult medical examination with abnormal findings: Secondary | ICD-10-CM | POA: Diagnosis not present

## 2019-03-14 DIAGNOSIS — Z130 Encounter for screening for diseases of the blood and blood-forming organs and certain disorders involving the immune mechanism: Secondary | ICD-10-CM | POA: Diagnosis not present

## 2019-03-14 DIAGNOSIS — K219 Gastro-esophageal reflux disease without esophagitis: Secondary | ICD-10-CM | POA: Insufficient documentation

## 2019-03-14 LAB — LIPID PANEL
Cholesterol: 234 mg/dL — ABNORMAL HIGH (ref 0–200)
HDL: 53.7 mg/dL (ref >39.00)
LDL Cholesterol: 163 mg/dL — ABNORMAL HIGH (ref 0–99)
NonHDL: 180.08
Total CHOL/HDL Ratio: 4
Triglycerides: 87 mg/dL (ref 0.0–149.0)
VLDL: 17.4 mg/dL (ref 0.0–40.0)

## 2019-03-14 LAB — COMPREHENSIVE METABOLIC PANEL
ALT: 12 U/L (ref 0–35)
AST: 13 U/L (ref 0–37)
Albumin: 4.3 g/dL (ref 3.5–5.2)
Alkaline Phosphatase: 84 U/L (ref 39–117)
BUN: 14 mg/dL (ref 6–23)
CO2: 31 mEq/L (ref 19–32)
Calcium: 9.9 mg/dL (ref 8.4–10.5)
Chloride: 102 mEq/L (ref 96–112)
Creatinine, Ser: 0.76 mg/dL (ref 0.40–1.20)
GFR: 77.08 mL/min (ref >60.00)
Glucose, Bld: 96 mg/dL (ref 70–99)
Potassium: 4.4 mEq/L (ref 3.5–5.1)
Sodium: 140 mEq/L (ref 135–145)
Total Bilirubin: 0.5 mg/dL (ref 0.2–1.2)
Total Protein: 6.9 g/dL (ref 6.0–8.3)

## 2019-03-14 LAB — CBC
HCT: 40.8 % (ref 36.0–46.0)
Hemoglobin: 14 g/dL (ref 12.0–15.0)
MCHC: 34.3 g/dL (ref 30.0–36.0)
MCV: 87.6 fl (ref 78.0–100.0)
Platelets: 193 10*3/uL (ref 150.0–400.0)
RBC: 4.65 Mil/uL (ref 3.87–5.11)
RDW: 13.2 % (ref 11.5–15.5)
WBC: 5.7 10*3/uL (ref 4.0–10.5)

## 2019-03-14 LAB — TSH: TSH: 1.06 u[IU]/mL (ref 0.35–4.50)

## 2019-03-14 LAB — VITAMIN D 25 HYDROXY (VIT D DEFICIENCY, FRACTURES): VITD: 22.62 ng/mL — ABNORMAL LOW (ref 30.00–100.00)

## 2019-03-14 MED ORDER — LOSARTAN POTASSIUM 25 MG PO TABS: 25.0000 mg | ORAL_TABLET | Freq: Every day | ORAL | 1 refills | Status: DC

## 2019-03-14 MED ORDER — PANTOPRAZOLE SODIUM 40 MG PO TBEC: 40.0000 mg | DELAYED_RELEASE_TABLET | Freq: Every day | ORAL | 3 refills | Status: DC

## 2019-03-14 NOTE — Telephone Encounter (Signed)
Rx for Protonix sent to the pharmacy by Dr. Martinique.

## 2019-03-14 NOTE — Assessment & Plan Note (Signed)
For now continue low-fat diet. Further recommendation will be given according to lipid panel results as well as 10 years CVD risk.

## 2019-03-14 NOTE — Assessment & Plan Note (Signed)
No changes in current management, will follow labs done today and will give further recommendations accordingly.  

## 2019-03-14 NOTE — Patient Instructions (Addendum)
A few things to remember from today's visit:   No diagnosis found.  Today you have you routine preventive visit.  At least 150 minutes of moderate exercise per week, daily brisk walking for 15-30 min is a good exercise option. Healthy diet low in saturated (animal) fats and sweets and consisting of fresh fruits and vegetables, lean meats such as fish and white chicken and whole grains.  These are some of recommendations for screening depending of age and risk factors:   - Vaccines:  Tdap vaccine every 10 years.  Shingles vaccine recommended at age 29, could be given after 62 years of age but not sure about insurance coverage.   Pneumonia vaccines:  Prevnar 13 at 65 and Pneumovax at 16. Sometimes Pneumovax is giving earlier if history of smoking, lung disease,diabetes,kidney disease among some.  Screening for diabetes at age 71 and every 3 years.  Cervical cancer prevention:  Pap smear starts at 62 years of age and continues periodically until 62 years old in low risk women. Pap smear every 3 years between 22 and 58 years old. Pap smear every 3-5 years between women 14 and older if pap smear negative and HPV screening negative.   -Breast cancer: Mammogram: There is disagreement between experts about when to start screening in low risk asymptomatic female but recent recommendations are to start screening at 23 and not later than 62 years old , every 1-2 years and after 62 yo q 2 years. Screening is recommended until 62 years old but some women can continue screening depending of healthy issues.   Colon cancer screening: starts at 62 years old until 62 years old.  Cholesterol disorder screening at age 26 and every 3 years.  Also recommended:  1. Dental visit- Brush and floss your teeth twice daily; visit your dentist twice a year. 2. Eye doctor- Get an eye exam at least every 2 years. 3. Helmet use- Always wear a helmet when riding a bicycle, motorcycle, rollerblading or  skateboarding. 4. Safe sex- If you may be exposed to sexually transmitted infections, use a condom. 5. Seat belts- Seat belts can save your live; always wear one. 6. Smoke/Carbon Monoxide detectors- These detectors need to be installed on the appropriate level of your home. Replace batteries at least once a year. 7. Skin cancer- When out in the sun please cover up and use sunscreen 15 SPF or higher. 8. Violence- If anyone is threatening or hurting you, please tell your healthcare provider.  9. Drink alcohol in moderation- Limit alcohol intake to one drink or less per day. Never drink and drive.  Please be sure medication list is accurate. If a new problem present, please set up appointment sooner than planned today.

## 2019-03-14 NOTE — Assessment & Plan Note (Signed)
This problem could be contributing to persistent cough. We discussed some side effects of PPIs as well as complications of persistent heartburn. GERD precautions discussed. Protonix 40 mg daily for 3 to 4 weeks, if cough resolves we can try to decrease Protonix to 20 mg and then as needed.

## 2019-03-14 NOTE — Telephone Encounter (Signed)
Copied from [place] [id number]. Topic: Quick Communication - Rx Refill/Question >> Mar 14, 2019  3:26 PM Tiffany Duran wrote: Medication: Something for heartburn  States she spoke with doctor about it but nothing has been called in yet.  Preferred Pharmacy (with phone number or street name): [id number] - [place], [place] - [address] [phone] (Phone) [phone] (Fax)  Agent: Please be advised that RX refills may take up to 3 business days. We ask that you follow-up with your pharmacy.

## 2019-03-14 NOTE — Progress Notes (Signed)
HPI:   [name] is a 63 y.o. female, who is here today for her routine physical.  Last CPE: Gynecologic routine exam in 01/2018.  Regular exercise 3 or more time per week: Not consistently Following a healthful diet: She thinks she does. She lives with her husband.  Chronic medical problems:  Vitamin D deficiency, currently she is on OTC vitamin D 1000 units daily. Last 25 OH vit D 31.7 (12.3).  HTN,concerned about elevated BP's for the past few weeks, 150s/90s. She denies unusual headache, visual changes, chest pain, dyspnea, palpitation, focal neurologic deficit, or edema. She has had elevated BPs in the past, she has been on nonpharmacologic treatment. Negative for unusual stress. She attributes problem to weight gain.  She has history of overactive bladder, she was taking Myrbetriq 50 mg daily, problem improved with [place] exercises, so she discontinued medication.  Hyperlipidemia: She has been on nonpharmacologic treatment.  Component     Latest Ref Rng & Units 01/22/2018  Cholesterol, Total     100 - 199 mg/dL 225 (H)  Triglycerides     0.0 - 149.0 mg/dL 128  HDL Cholesterol     >39.00 mg/dL 57  VLDL Cholesterol Cal     5 - 40 mg/dL 26  LDL (calc)     0 - 99 mg/dL 142 (H)  Total CHOL/HDL Ratio      3.9   She is also complaining about persistent nonproductive cough for the past 6 weeks. She has mild postnasal drainage, she feels like she needs to clear her throat periodically. No associated wheezing. Zyrtec 10 mg daily has helped some. He has not identified exacerbating factors.  She also has intermittent episodes of heartburn, exacerbated by certain foods. Negative for nausea, vomiting, changes in bowel habits, blood in the stool, melena.   Pap smear 01/2018, she follows with gynecology regularly.   Immunization History  Administered Date(s) Administered  . Influenza-Unspecified 10/27/2011  Tdap in 11/2021.  Mammogram: 03/2018 BI-RADS  1. Colonoscopy: 03/2011, 10-year follow-up was recommended. DEXA: 2009, normal.  Hep C screening: 05/2012 NR.  Review of Systems  Constitutional: Negative for appetite change, fatigue and fever.  HENT: Negative for dental problem, hearing loss, mouth sores, sore throat, trouble swallowing and voice change.   Eyes: Negative for redness and visual disturbance.  Respiratory: Negative for shortness of breath and wheezing.   Cardiovascular: Negative for chest pain and leg swelling.  Gastrointestinal: Negative for abdominal pain, nausea and vomiting.       No changes in bowel habits.  Endocrine: Negative for cold intolerance, heat intolerance, polydipsia, polyphagia and polyuria.  Genitourinary: Negative for decreased urine volume, dysuria, hematuria, vaginal bleeding and vaginal discharge.  Musculoskeletal: Negative for arthralgias, gait problem and myalgias.  Skin: Negative for color change and rash.  Allergic/Immunologic: Positive for environmental allergies.  Neurological: Negative for syncope, weakness and headaches.  Hematological: Negative for adenopathy. Does not bruise/bleed easily.  Psychiatric/Behavioral: Negative for confusion and sleep disturbance. The patient is not nervous/anxious.   All other systems reviewed and are negative.   Current Outpatient Medications on File Prior to Visit  Medication Sig Dispense Refill  . Cholecalciferol (VITAMIN D3 PO) Take by mouth daily.     No current facility-administered medications on file prior to visit.      Past Medical History:  Diagnosis Date  . Family history of polyps in the colon   . Fibroid   . Frequent headaches   . GERD (gastroesophageal  reflux disease)   . Hay fever   . UTI (urinary tract infection)     Past Surgical History:  Procedure Laterality Date  .  ablation uterine      Allergies  Allergen Reactions  . Latex Anxiety and Shortness Of Breath  . Amoxicillin Hives and Itching    Family History  Problem  Relation Age of Onset  . Early death Father   . Hypertension Mother   . Breast cancer Mother        after menopause  . Hypertension Brother     Social History   Socioeconomic History  . Marital status: Married    Spouse name: Not on file  . Number of children: Not on file  . Years of education: Not on file  . Highest education level: Not on file  Occupational History  . Not on file  Social Needs  . Financial resource strain: Not on file  . Food insecurity:    Worry: Not on file    Inability: Not on file  . Transportation needs:    Medical: Not on file    Non-medical: Not on file  Tobacco Use  . Smoking status: Never Smoker  . Smokeless tobacco: Never Used  Substance and Sexual Activity  . Alcohol use: Yes    Alcohol/week: 2.0 - 3.0 standard drinks    Types: 2 - 3 Standard drinks or equivalent per week  . Drug use: No  . Sexual activity: Yes    Partners: Male    Birth control/protection: Post-menopausal  Lifestyle  . Physical activity:    Days per week: Not on file    Minutes per session: Not on file  . Stress: Not on file  Relationships  . Social connections:    Talks on phone: Not on file    Gets together: Not on file    Attends religious service: Not on file    Active member of club or organization: Not on file    Attends meetings of clubs or organizations: Not on file    Relationship status: Not on file  Other Topics Concern  . Not on file  Social History Narrative  . Not on file     Vitals:   03/14/19 0819 03/14/19 1819  BP: 130/80 (!) 145/85  Pulse: 67   Resp: 12   Temp: 97.9 F (36.6 C)   SpO2: 98%    Body mass index is 27.02 kg/m.   Wt Readings from Last 3 Encounters:  03/14/19 172 lb 8 oz (78.2 kg)  03/12/18 163 lb 4 oz (74 kg)  01/22/18 158 lb (71.7 kg)    Physical Exam  Nursing note and vitals reviewed. Constitutional: She is oriented to person, place, and time. She appears well-developed and well-nourished. No distress.  HENT:   Head: Normocephalic and atraumatic.  Right Ear: Hearing, tympanic membrane, external ear and ear canal normal.  Left Ear: Hearing, tympanic membrane, external ear and ear canal normal.  Mouth/Throat: Uvula is midline, oropharynx is clear and moist and mucous membranes are normal.  Eyes: Pupils are equal, round, and reactive to light. Conjunctivae and EOM are normal.  Neck: No tracheal deviation present. No thyromegaly present.  Cardiovascular: Normal rate and regular rhythm.  No murmur heard. Pulses:      Dorsalis pedis pulses are 2+ on the right side, and 2+ on the left side.  Respiratory: Effort normal and breath sounds normal. No respiratory distress.  GI: Soft. She exhibits no mass. There is no  hepatomegaly. There is no tenderness.  Genitourinary:Comments: Deferred to gyn.  Musculoskeletal: She exhibits no edema.  No major deformity or signs of synovitis appreciated.  Lymphadenopathy:    She has no cervical adenopathy.       Right: No supraclavicular adenopathy present.       Left: No supraclavicular adenopathy present.  Neurological: She is alert and oriented to person, place, and time. She has normal strength. No cranial nerve deficit. Coordination and gait normal.  Reflex Scores:      Bicep reflexes are 2+ on the right side and 2+ on the left side.      Patellar reflexes are 2+ on the right side and 2+ on the left side. Skin: Skin is warm. No rash noted. No erythema.  Psychiatric: She has a normal mood and affect. Cognitive function grossly intact. Well groomed, good eye contact.     ASSESSMENT AND PLAN:  Ms. Tiffany Duran was here today annual physical examination and follow up.   Orders Placed This Encounter  Procedures  . VITAMIN D 25 Hydroxy (Vit-D Deficiency, Fractures)  . Comprehensive metabolic panel  . CBC  . TSH  . Lipid panel   Lab Results  Component Value Date   CHOL 234 (H) 03/14/2019   HDL 53.70 03/14/2019   LDLCALC 163 (H) 03/14/2019   TRIG  87.0 03/14/2019   CHOLHDL 4 03/14/2019   Lab Results  Component Value Date   TSH 1.06 03/14/2019   Lab Results  Component Value Date   ALT 12 03/14/2019   AST 13 03/14/2019   ALKPHOS 84 03/14/2019   BILITOT 0.5 03/14/2019   Lab Results  Component Value Date   CREATININE 0.76 03/14/2019   BUN 14 03/14/2019   NA 140 03/14/2019   K 4.4 03/14/2019   CL 102 03/14/2019   CO2 31 03/14/2019    Routine general medical examination at a health care facility We discussed the importance of regular physical activity and healthy diet for prevention of chronic illness and/or complications. Preventive guidelines reviewed. Vaccination up to date. She will continue following with gynecologist for her female preventive care.  Ca++ and vit D supplementation to continue. Next CPE in a year.   The 10-year ASCVD risk score Mikey Bussing DC Brooke Bonito., et al., 2013) is: 7.8%   Values used to calculate the score:     Age: 8 years     Sex: Female     Is Non-Hispanic African American: No     Diabetic: No     Tobacco smoker: No     Systolic Blood Pressure: 175 mmHg     Is BP treated: Yes     HDL Cholesterol: 53.7 mg/dL     Total Cholesterol: 234 mg/dL  Screening for endocrine, metabolic and immunity disorder -     Comprehensive metabolic panel  Cough We discussed possible etiologies. Improved with Zyrtec, so allergies could be a contributing factor. ? GERD. I do not think imaging is needed at this time. Instructed about warning signs. F/U in 3-4 months.   Hypertension, essential, benign Not well controlled. Possible complications of elevated BP discussed. After discussion of some pharmacologic options, she agrees with trying losartan 25 mg daily. BMP in 7 to 10 days. Continue monitoring BP regularly. DASH/Low-salt diet recommended. Instructed about warning signs. Follow-up in 3 to 4 months.   Hyperlipidemia For now continue low-fat diet. Further recommendation will be given according to  lipid panel results as well as 10 years CVD risk.  Vitamin  D deficiency, unspecified No changes in current management, will follow labs done today and will give further recommendations accordingly.   GERD (gastroesophageal reflux disease) This problem could be contributing to persistent cough. We discussed some side effects of PPIs as well as complications of persistent heartburn. GERD precautions discussed. Protonix 40 mg daily for 3 to 4 weeks, if cough resolves we can try to decrease Protonix to 20 mg and then as needed.        Return in 4 months (on 07/15/2019) for HTN and labs in 10 days.      Betty G. Martinique, MD  [facility]. [place] office.

## 2019-03-14 NOTE — Assessment & Plan Note (Addendum)
Not well controlled. Possible complications of elevated BP discussed. After discussion of some pharmacologic options, she agrees with trying losartan 25 mg daily. BMP in 7 to 10 days. Continue monitoring BP regularly. DASH/Low-salt diet recommended. Instructed about warning signs. Follow-up in 3 to 4 months.

## 2019-03-15 ENCOUNTER — Encounter: Payer: Self-pay | Admitting: Family Medicine

## 2019-03-21 ENCOUNTER — Other Ambulatory Visit: Payer: Self-pay | Admitting: Family Medicine

## 2019-03-21 DIAGNOSIS — Z1231 Encounter for screening mammogram for malignant neoplasm of breast: Secondary | ICD-10-CM

## 2019-03-24 ENCOUNTER — Other Ambulatory Visit (INDEPENDENT_AMBULATORY_CARE_PROVIDER_SITE_OTHER): Payer: BLUE CROSS/BLUE SHIELD

## 2019-03-24 ENCOUNTER — Other Ambulatory Visit: Payer: Self-pay | Admitting: Family Medicine

## 2019-03-24 ENCOUNTER — Other Ambulatory Visit: Payer: Self-pay

## 2019-03-24 DIAGNOSIS — I10 Essential (primary) hypertension: Secondary | ICD-10-CM

## 2019-03-24 LAB — BASIC METABOLIC PANEL
BUN: 17 mg/dL (ref 6–23)
CO2: 28 mEq/L (ref 19–32)
Calcium: 9.3 mg/dL (ref 8.4–10.5)
Chloride: 104 mEq/L (ref 96–112)
Creatinine, Ser: 0.7 mg/dL (ref 0.40–1.20)
GFR: 84.75 mL/min (ref >60.00)
Glucose, Bld: 88 mg/dL (ref 70–99)
Potassium: 4.1 mEq/L (ref 3.5–5.1)
Sodium: 141 mEq/L (ref 135–145)

## 2019-03-25 ENCOUNTER — Encounter: Payer: Self-pay | Admitting: Family Medicine

## 2019-04-01 NOTE — Telephone Encounter (Signed)
Please advise? Do you have a preference?  

## 2019-05-08 DIAGNOSIS — M205X1 Other deformities of toe(s) (acquired), right foot: Secondary | ICD-10-CM | POA: Diagnosis not present

## 2019-05-08 DIAGNOSIS — M2022 Hallux rigidus, left foot: Secondary | ICD-10-CM | POA: Diagnosis not present

## 2019-05-08 DIAGNOSIS — M205X2 Other deformities of toe(s) (acquired), left foot: Secondary | ICD-10-CM | POA: Diagnosis not present

## 2019-05-08 DIAGNOSIS — M79672 Pain in left foot: Secondary | ICD-10-CM | POA: Diagnosis not present

## 2019-05-09 ENCOUNTER — Ambulatory Visit
Admission: RE | Admit: 2019-05-09 | Discharge: 2019-05-09 | Disposition: A | Discharge status: Home / Self Care | Payer: BC Managed Care – PPO | Source: Ambulatory Visit | Attending: Family Medicine | Admitting: Family Medicine

## 2019-05-09 ENCOUNTER — Other Ambulatory Visit: Payer: Self-pay

## 2019-05-09 DIAGNOSIS — Z1231 Encounter for screening mammogram for malignant neoplasm of breast: Secondary | ICD-10-CM

## 2019-05-28 DIAGNOSIS — D1801 Hemangioma of skin and subcutaneous tissue: Secondary | ICD-10-CM | POA: Diagnosis not present

## 2019-05-28 DIAGNOSIS — L821 Other seborrheic keratosis: Secondary | ICD-10-CM | POA: Diagnosis not present

## 2019-05-28 DIAGNOSIS — L814 Other melanin hyperpigmentation: Secondary | ICD-10-CM | POA: Diagnosis not present

## 2019-05-28 DIAGNOSIS — D2372 Other benign neoplasm of skin of left lower limb, including hip: Secondary | ICD-10-CM | POA: Diagnosis not present

## 2019-05-28 DIAGNOSIS — D239 Other benign neoplasm of skin, unspecified: Secondary | ICD-10-CM | POA: Diagnosis not present

## 2019-05-29 DIAGNOSIS — Z03818 Encounter for observation for suspected exposure to other biological agents ruled out: Secondary | ICD-10-CM | POA: Diagnosis not present

## 2019-06-13 ENCOUNTER — Other Ambulatory Visit: Payer: Self-pay

## 2019-06-13 ENCOUNTER — Encounter: Payer: Self-pay | Admitting: Family Medicine

## 2019-06-13 ENCOUNTER — Ambulatory Visit (INDEPENDENT_AMBULATORY_CARE_PROVIDER_SITE_OTHER): Payer: BC Managed Care – PPO | Admitting: Family Medicine

## 2019-06-13 VITALS — BP 140/90 | HR 62 | Temp 97.8°F | Resp 12 | Ht 67.0 in | Wt 169.4 lb

## 2019-06-13 DIAGNOSIS — I10 Essential (primary) hypertension: Secondary | ICD-10-CM

## 2019-06-13 DIAGNOSIS — K219 Gastro-esophageal reflux disease without esophagitis: Secondary | ICD-10-CM | POA: Diagnosis not present

## 2019-06-13 MED ORDER — LOSARTAN POTASSIUM 25 MG PO TABS: 50.0000 mg | ORAL_TABLET | Freq: Every day | ORAL | 1 refills | Status: DC

## 2019-06-13 NOTE — Patient Instructions (Signed)
A few things to remember from today's visit:   Essential hypertension  Today blood pressure when rechecked 140/100 and then 140/90. Increase losartan from 25 mg to 50 mg. Continue monitoring your blood pressure and send me blood pressure readings in 2 weeks. We will plan on checking labs next visit.  Increase Please be sure medication list is accurate. If a new problem present, please set up appointment sooner than planned today.

## 2019-06-13 NOTE — Progress Notes (Signed)
HPI:   Tiffany Duran is a 63 y.o. female, who is here today for chronic disease management. She was last seen on 03/14/2019.  Currently she is on losartan 25 mg daily, started last visit. She is tolerating medication well. She has been monitoring BP at home, usually 150/90. Denies severe/frequent headache, visual changes, chest pain, dyspnea, palpitation, claudication, focal weakness, or edema.  Lab Results  Component Value Date   CREATININE 0.70 03/24/2019   BUN 17 03/24/2019   NA 141 03/24/2019   K 4.1 03/24/2019   CL 104 03/24/2019   CO2 28 03/24/2019    Last visit she was also complaining about persistent cough. She started Protonix 40 mg daily. Today she is reporting that cough has resolved. Negative for abdominal pain,N/V,or changes in bowel habits.   Review of Systems  Constitutional: Negative for activity change, appetite change, fatigue, fever and unexpected weight change.  HENT: Negative for mouth sores, nosebleeds and trouble swallowing.   Eyes: Negative for pain and redness.  Respiratory: Negative for wheezing.   Gastrointestinal:       Negative for changes in bowel habits.  Genitourinary: Negative for decreased urine volume, dysuria and hematuria.  Musculoskeletal: Negative for gait problem and myalgias.  Neurological: Negative for syncope and facial asymmetry.  Rest see pertinent positives and negatives per HPI.   Current Outpatient Medications on File Prior to Visit  Medication Sig Dispense Refill  . Cholecalciferol (VITAMIN D3 PO) Take by mouth daily.    . pantoprazole (PROTONIX) 40 MG tablet Take 1 tablet (40 mg total) by mouth daily. 30 tablet 3   No current facility-administered medications on file prior to visit.      Past Medical History:  Diagnosis Date  . Family history of polyps in the colon   . Fibroid   . Frequent headaches   . GERD (gastroesophageal reflux disease)   . Hay fever   . UTI (urinary tract infection)     Allergies  Allergen Reactions  . Latex Anxiety and Shortness Of Breath  . Amoxicillin Hives and Itching    Social History   Socioeconomic History  . Marital status: Married    Spouse name: Not on file  . Number of children: Not on file  . Years of education: Not on file  . Highest education level: Not on file  Occupational History  . Not on file  Social Needs  . Financial resource strain: Not on file  . Food insecurity    Worry: Not on file    Inability: Not on file  . Transportation needs    Medical: Not on file    Non-medical: Not on file  Tobacco Use  . Smoking status: Never Smoker  . Smokeless tobacco: Never Used  Substance and Sexual Activity  . Alcohol use: Yes    Alcohol/week: 2.0 - 3.0 standard drinks    Types: 2 - 3 Standard drinks or equivalent per week  . Drug use: No  . Sexual activity: Yes    Partners: Male    Birth control/protection: Post-menopausal  Lifestyle  . Physical activity    Days per week: Not on file    Minutes per session: Not on file  . Stress: Not on file  Relationships  . Social Herbalist on phone: Not on file    Gets together: Not on file    Attends religious service: Not on file    Active member of club or organization: Not  on file    Attends meetings of clubs or organizations: Not on file    Relationship status: Not on file  Other Topics Concern  . Not on file  Social History Narrative  . Not on file    Vitals:   06/13/19 1004 06/13/19 1037  BP: 122/84 140/90  Pulse: 62   Resp: 12   Temp: 97.8 F (36.6 C)   SpO2: 98%    Body mass index is 26.53 kg/m.  Physical Exam  Nursing note and vitals reviewed. Constitutional: She is oriented to person, place, and time. She appears well-developed. No distress.  HENT:  Head: Normocephalic and atraumatic.  Mouth/Throat: Oropharynx is clear and moist and mucous membranes are normal.  Eyes: Pupils are equal, round, and reactive to light. Conjunctivae are normal.   Cardiovascular: Normal rate and regular rhythm.  No murmur heard. Pulses:      Dorsalis pedis pulses are 2+ on the right side and 2+ on the left side.  Respiratory: Effort normal and breath sounds normal. No respiratory distress.  GI: Soft. She exhibits no mass. There is no hepatomegaly. There is no abdominal tenderness.  Musculoskeletal:        General: No edema.  Lymphadenopathy:    She has no cervical adenopathy.  Neurological: She is alert and oriented to person, place, and time. She has normal strength. No cranial nerve deficit. Gait normal.  Skin: Skin is warm. No rash noted. No erythema.  Psychiatric: She has a normal mood and affect.  Well groomed, good eye contact.   ASSESSMENT AND PLAN:  Tiffany Duran was seen today for follow-up.  Diagnoses and all orders for this visit:  Essential hypertension BP rechecked: 140/101 and 140/90. BP readings at home mildly high, so recommend increasing dose of Cozaar from 25 mg to 50 mg. Continue monitoring BP regularly and let me know about BP readings in 2 weeks. Continue low-salt diet. Follow-up in 4 months, before if needed.  -     losartan (COZAAR) 25 MG tablet; Take 2 tablets (50 mg total) by mouth daily.  Gastroesophageal reflux disease, esophagitis presence not specified Continue Protonix 40 mg daily. GERD precautions also to continue.   We will plan on checking lipid panel and vitamin D next visit.  Return in about 4 months (around 10/13/2019) for htn,hld.   -Tiffany Duran was advised to return sooner than planned today if new concerns arise.    Tiffany Martos G. Martinique, MD  [facility]. [place] office.

## 2019-07-08 ENCOUNTER — Other Ambulatory Visit: Payer: Self-pay | Admitting: Family Medicine

## 2019-09-13 ENCOUNTER — Other Ambulatory Visit: Payer: Self-pay | Admitting: Family Medicine

## 2019-09-13 DIAGNOSIS — I10 Essential (primary) hypertension: Secondary | ICD-10-CM

## 2019-09-22 ENCOUNTER — Other Ambulatory Visit: Payer: Self-pay

## 2019-09-22 DIAGNOSIS — I10 Essential (primary) hypertension: Secondary | ICD-10-CM

## 2019-09-22 MED ORDER — LOSARTAN POTASSIUM 25 MG PO TABS: 50.0000 mg | ORAL_TABLET | Freq: Every day | ORAL | 5 refills | Status: DC

## 2019-10-13 ENCOUNTER — Ambulatory Visit: Payer: BC Managed Care – PPO | Admitting: Family Medicine

## 2019-11-14 ENCOUNTER — Other Ambulatory Visit: Payer: Self-pay | Admitting: Family Medicine

## 2019-12-12 ENCOUNTER — Other Ambulatory Visit: Payer: Self-pay

## 2019-12-15 ENCOUNTER — Ambulatory Visit: Payer: BLUE CROSS/BLUE SHIELD | Admitting: Obstetrics and Gynecology

## 2019-12-17 ENCOUNTER — Ambulatory Visit: Payer: BLUE CROSS/BLUE SHIELD | Admitting: Obstetrics and Gynecology

## 2020-04-08 ENCOUNTER — Other Ambulatory Visit: Payer: Self-pay | Admitting: Family Medicine

## 2020-04-08 DIAGNOSIS — I10 Essential (primary) hypertension: Secondary | ICD-10-CM

## 2020-04-08 NOTE — Telephone Encounter (Signed)
Patient need to schedule an ov for more refills. 

## 2020-05-06 ENCOUNTER — Telehealth: Payer: Self-pay | Admitting: Family Medicine

## 2020-05-06 DIAGNOSIS — I10 Essential (primary) hypertension: Secondary | ICD-10-CM

## 2020-05-06 NOTE — Telephone Encounter (Signed)
Pt is calling in stating that she has moved to [place] and is in the process of finding her a providers.  Pt stated that she would only need to have one more refill.  Rx losartan (COZAAR) 25 MG and pantoprazole (PROTONIX) 40 MG  Pharm:  [facility] on [address], [place], [place].   Pt is aware that she may need to be seen before refill.  Last seen on 06/13/2019 by Dr. Martinique

## 2020-05-07 MED ORDER — LOSARTAN POTASSIUM 25 MG PO TABS: 50.0000 mg | ORAL_TABLET | Freq: Every day | ORAL | 0 refills | Status: DC

## 2020-05-07 MED ORDER — PANTOPRAZOLE SODIUM 40 MG PO TBEC: 40.0000 mg | DELAYED_RELEASE_TABLET | Freq: Every day | ORAL | 0 refills | Status: DC

## 2020-05-07 NOTE — Telephone Encounter (Signed)
Rx's sent in x 30 days with 0 refills as requested until pt finds new provider.

## 2020-05-31 ENCOUNTER — Other Ambulatory Visit: Payer: Self-pay | Admitting: Family Medicine

## 2020-05-31 DIAGNOSIS — Z1231 Encounter for screening mammogram for malignant neoplasm of breast: Secondary | ICD-10-CM

## 2020-06-07 ENCOUNTER — Encounter: Payer: Self-pay | Admitting: Family Medicine

## 2020-06-11 ENCOUNTER — Encounter: Payer: Self-pay | Admitting: Family Medicine

## 2020-06-11 ENCOUNTER — Other Ambulatory Visit: Payer: Self-pay

## 2020-06-11 ENCOUNTER — Ambulatory Visit
Admission: RE | Admit: 2020-06-11 | Discharge: 2020-06-11 | Disposition: A | Discharge status: Home / Self Care | Payer: 59 | Source: Ambulatory Visit

## 2020-06-11 ENCOUNTER — Ambulatory Visit (INDEPENDENT_AMBULATORY_CARE_PROVIDER_SITE_OTHER): Payer: 59 | Admitting: Family Medicine

## 2020-06-11 VITALS — BP 136/84 | HR 92 | Temp 98.7°F | Resp 12 | Ht 67.0 in | Wt 159.0 lb

## 2020-06-11 DIAGNOSIS — Z0000 Encounter for general adult medical examination without abnormal findings: Secondary | ICD-10-CM | POA: Diagnosis not present

## 2020-06-11 DIAGNOSIS — E559 Vitamin D deficiency, unspecified: Secondary | ICD-10-CM

## 2020-06-11 DIAGNOSIS — Z789 Other specified health status: Secondary | ICD-10-CM

## 2020-06-11 DIAGNOSIS — E785 Hyperlipidemia, unspecified: Secondary | ICD-10-CM

## 2020-06-11 DIAGNOSIS — Z13228 Encounter for screening for other metabolic disorders: Secondary | ICD-10-CM

## 2020-06-11 DIAGNOSIS — Z1231 Encounter for screening mammogram for malignant neoplasm of breast: Secondary | ICD-10-CM

## 2020-06-11 DIAGNOSIS — Z130 Encounter for screening for diseases of the blood and blood-forming organs and certain disorders involving the immune mechanism: Secondary | ICD-10-CM

## 2020-06-11 DIAGNOSIS — Z23 Encounter for immunization: Secondary | ICD-10-CM

## 2020-06-11 DIAGNOSIS — Z1329 Encounter for screening for other suspected endocrine disorder: Secondary | ICD-10-CM | POA: Diagnosis not present

## 2020-06-11 DIAGNOSIS — I10 Essential (primary) hypertension: Secondary | ICD-10-CM

## 2020-06-11 DIAGNOSIS — K219 Gastro-esophageal reflux disease without esophagitis: Secondary | ICD-10-CM

## 2020-06-11 MED ORDER — PANTOPRAZOLE SODIUM 20 MG PO TBEC: 20.0000 mg | DELAYED_RELEASE_TABLET | Freq: Every day | ORAL | 3 refills | Status: DC

## 2020-06-11 MED ORDER — LOSARTAN POTASSIUM 50 MG PO TABS: 50.0000 mg | ORAL_TABLET | Freq: Every day | ORAL | 2 refills | Status: DC

## 2020-06-11 NOTE — Assessment & Plan Note (Signed)
Continue Vit D 2000 U daily. Vit D dose will be adjusted, if needed,according to 25 OH vit D.

## 2020-06-11 NOTE — Progress Notes (Signed)
HPI: [name] is a 64 y.o. female, who is here today for her routine physical.  Last CPE: 03/14/2019. No new problems since her last visit, 06/13/2019.  Regular exercise 3 or more time per week: Swimming daily. Following a healthy diet: Yes, in general. She lives with her husband. Recently moved to [facility].  Chronic medical problems: Hypertension, vitamin D deficiency, and hyperlipidemia.  Pap smear: 2018.she follows with gynecology regularly, next appt   Immunization History  Administered Date(s) Administered  . Influenza-Unspecified 10/27/2011  . Zoster Recombinat (Shingrix) 06/11/2020   Mammogram: 05/09/19, appt today. Colonoscopy:04/03/11, 10 years f/u recommended. DEXA: N/A Hep C screening: 05/2012 NR.  Chronic disease management.  Hyperlipidemia:She is on non pharmacologic treatment. Lab Results  Component Value Date   CHOL 234 (H) 03/14/2019   HDL 53.70 03/14/2019   LDLCALC 163 (H) 03/14/2019   TRIG 87.0 03/14/2019   CHOLHDL 4 03/14/2019   GERD: She is on Protonix 40 mg daily. She has been asymptomatic since PPI was started. No abdominal pain or melena.  Hypertension:She is on Losartan 50 mg daily. Home BP's 130-138/70's.  Negative for severe/frequent headache,chest pain, dyspnea, or edema.  Lab Results  Component Value Date   CREATININE 0.70 03/24/2019   BUN 17 03/24/2019   NA 141 03/24/2019   K 4.1 03/24/2019   CL 104 03/24/2019   CO2 28 03/24/2019   Vit D deficiency: On Vit D 2000 U daily. 25 OH vit D on 03/13/20 was low at 22.6.  She would like to have COVID 19 IgG ab. She was very sick in 02/2020, she thinks she may have had COVID 19 infection. No residual symptoms. She does not have COVID 19 vaccine.   Review of Systems  Constitutional: Negative for appetite change and fever.  HENT: Negative for dental problem, hearing loss, mouth sores, sore throat, trouble swallowing and voice change.   Eyes: Negative for redness and visual  disturbance.  Respiratory: Negative for cough and wheezing.   Cardiovascular: Negative for chest pain and palpitations.  Gastrointestinal: Negative for nausea and vomiting.       No changes in bowel habits.  Endocrine: Negative for cold intolerance, heat intolerance, polydipsia, polyphagia and polyuria.  Genitourinary: Negative for decreased urine volume, dysuria, hematuria, vaginal bleeding and vaginal discharge.  Musculoskeletal: Negative for arthralgias, gait problem and myalgias.  Skin: Negative for color change and rash.  Allergic/Immunologic: Positive for environmental allergies.  Neurological: Negative for syncope, facial asymmetry and weakness.  Hematological: Negative for adenopathy. Does not bruise/bleed easily.  Psychiatric/Behavioral: Negative for confusion and sleep disturbance. The patient is not nervous/anxious.   All other systems reviewed and are negative.  Current Outpatient Medications on File Prior to Visit  Medication Sig Dispense Refill  . Cholecalciferol (VITAMIN D3 PO) Take by mouth daily.     No current facility-administered medications on file prior to visit.   Past Medical History:  Diagnosis Date  . Family history of polyps in the colon   . Fibroid   . Frequent headaches   . GERD (gastroesophageal reflux disease)   . Hay fever   . UTI (urinary tract infection)     Past Surgical History:  Procedure Laterality Date  .  ablation uterine     Allergies  Allergen Reactions  . Latex Anxiety and Shortness Of Breath  . Amoxicillin Hives and Itching    Family History  Problem Relation Age of Onset  . Early death Father   . Hypertension Mother   .  Breast cancer Mother        after menopause  . Hypertension Brother     Social History   Socioeconomic History  . Marital status: Married    Spouse name: Not on file  . Number of children: Not on file  . Years of education: Not on file  . Highest education level: Not on file  Occupational History    . Not on file  Tobacco Use  . Smoking status: Never Smoker  . Smokeless tobacco: Never Used  Vaping Use  . Vaping Use: Never used  Substance and Sexual Activity  . Alcohol use: Yes    Alcohol/week: 2.0 - 3.0 standard drinks    Types: 2 - 3 Standard drinks or equivalent per week  . Drug use: No  . Sexual activity: Yes    Partners: Male    Birth control/protection: Post-menopausal  Other Topics Concern  . Not on file  Social History Narrative  . Not on file   Social Determinants of Health   Financial Resource Strain:   . Difficulty of Paying Living Expenses: Not on file  Food Insecurity:   . Worried About Charity fundraiser in the Last Year: Not on file  . Ran Out of Food in the Last Year: Not on file  Transportation Needs:   . Lack of Transportation (Medical): Not on file  . Lack of Transportation (Non-Medical): Not on file  Physical Activity:   . Days of Exercise per Week: Not on file  . Minutes of Exercise per Session: Not on file  Stress:   . Feeling of Stress : Not on file  Social Connections:   . Frequency of Communication with Friends and Family: Not on file  . Frequency of Social Gatherings with Friends and Family: Not on file  . Attends Religious Services: Not on file  . Active Member of Clubs or Organizations: Not on file  . Attends Archivist Meetings: Not on file  . Marital Status: Not on file   Vitals:   06/11/20 1405  BP: 136/84  Pulse: 92  Resp: 12  Temp: 98.7 F (37.1 C)  SpO2: 98%   Body mass index is 24.9 kg/m.  Wt Readings from Last 3 Encounters:  06/11/20 159 lb (72.1 kg)  06/13/19 169 lb 6 oz (76.8 kg)  03/14/19 172 lb 8 oz (78.2 kg)   Physical Exam Vitals and nursing note reviewed.  Constitutional:      General: She is not in acute distress.    Appearance: She is well-developed and normal weight.  HENT:     Head: Normocephalic and atraumatic.     Right Ear: Hearing, tympanic membrane, ear canal and external ear normal.      Left Ear: Hearing, tympanic membrane, ear canal and external ear normal.     Mouth/Throat:     Mouth: Mucous membranes are moist.     Pharynx: Oropharynx is clear. Uvula midline.  Eyes:     Extraocular Movements: Extraocular movements intact.     Conjunctiva/sclera: Conjunctivae normal.     Pupils: Pupils are equal, round, and reactive to light.  Neck:     Thyroid: No thyromegaly.     Trachea: No tracheal deviation.  Cardiovascular:     Rate and Rhythm: Normal rate and regular rhythm.     Pulses:          Dorsalis pedis pulses are 2+ on the right side and 2+ on the left side.  Heart sounds: No murmur heard.   Pulmonary:     Effort: Pulmonary effort is normal. No respiratory distress.     Breath sounds: Normal breath sounds.  Abdominal:     Palpations: Abdomen is soft. There is no hepatomegaly or mass.     Tenderness: There is no abdominal tenderness.  Genitourinary:    Comments: Deferred to gyn. Musculoskeletal:     Comments: No signs of synovitis appreciated.  Lymphadenopathy:     Cervical: No cervical adenopathy.     Upper Body:     Right upper body: No supraclavicular adenopathy.     Left upper body: No supraclavicular adenopathy.  Skin:    General: Skin is warm.     Findings: No erythema or rash.  Neurological:     Mental Status: She is alert and oriented to person, place, and time.     Cranial Nerves: No cranial nerve deficit.     Coordination: Coordination normal.     Gait: Gait normal.     Deep Tendon Reflexes:     Reflex Scores:      Bicep reflexes are 2+ on the right side and 2+ on the left side.      Patellar reflexes are 2+ on the right side and 2+ on the left side. Psychiatric:        Mood and Affect: Mood and affect normal.        Cognition and Memory: Cognition normal.     Comments: Well groomed, good eye contact.    ASSESSMENT AND PLAN:  Ms. Tiffany Duran was here today annual physical examination.  Orders Placed This Encounter   Procedures  . Varicella-zoster vaccine IM  . SAR CoV2 Serology (COVID 19)AB(IGG)IA  . VITAMIN D 25 Hydroxy (Vit-D Deficiency, Fractures)  . Basic Metabolic Panel  . Lipid panel  . SARS-CoV-2 Antibody(IgG)Spike,Semi-Quantitative    Routine general medical examination at a health care facility We discussed the importance of regular physical activity and healthy diet for prevention of chronic illness and/or complications. Preventive guidelines reviewed. Vaccination up to date.  Ca++ and vit D supplementation recommended. Next CPE in a year.  The 10-year ASCVD risk score Mikey Bussing DC Brooke Bonito., et al., 2013) is: 7.6%   Values used to calculate the score:     Age: 4 years     Sex: Female     Is Non-Hispanic African American: No     Diabetic: No     Tobacco smoker: No     Systolic Blood Pressure: 161 mmHg     Is BP treated: Yes     HDL Cholesterol: 53.7 mg/dL     Total Cholesterol: 234 mg/dL  Essential hypertension Otherwise BP adequately controlled. No changes in current management. Continue low salt diet and monitoring BP regularly.  -     losartan (COZAAR) 50 MG tablet; Take 1 tablet (50 mg total) by mouth daily.  Unknown status of immunity to COVID-19 virus -     SAR CoV2 Serology (COVID 19)AB(IGG)IA  Screening for endocrine, metabolic and immunity disorder -     Cancel: BASIC METABOLIC PANEL WITH GFR; Future  Hyperlipidemia, unspecified hyperlipidemia type Continue non pharmacologic treatment. Further recommendations according to 10 years CVD score and lipid numbers.  She is not fasting today, so lab orders given ,so she can have labs done closer to home.  Need for shingles vaccine -     Varicella-zoster vaccine IM  Gastroesophageal reflux disease, unspecified whether esophagitis present Well controlled. Continue current management.  GERD precautions to continue.  -     pantoprazole (PROTONIX) 20 MG tablet; Take 1 tablet (20 mg total) by mouth daily.  Vitamin D  deficiency, unspecified Continue Vit D 2000 U daily. Vit D dose will be adjusted, if needed,according to 25 OH vit D.  Return in 6 months (on 12/12/2020) for htn.  Jaquese Irving G. [place], MD  [facility]. [place] office.  Today you have you routine preventive visit. A few things to remember from today's visit:   Routine general medical examination at a health care facility  Essential hypertension - Plan: losartan (COZAAR) 50 MG tablet  Unknown status of immunity to COVID-19 virus - Plan: SARS-COV-2 IgG  Vitamin D deficiency, unspecified - Plan: VITAMIN D 25 Hydroxy (Vit-D Deficiency, Fractures)  Screening for endocrine, metabolic and immunity disorder - Plan: BASIC METABOLIC PANEL WITH GFR  Hyperlipidemia, unspecified hyperlipidemia type - Plan: Lipid panel  If you need refills please call your pharmacy. Do not use My Chart to request refills or for acute issues that need immediate attention.    Please be sure medication list is accurate. If a new problem present, please set up appointment sooner than planned today.  At least 150 minutes of moderate exercise per week, daily brisk walking for 15-30 min is a good exercise option. Healthy diet low in saturated (animal) fats and sweets and consisting of fresh fruits and vegetables, lean meats such as fish and white chicken and whole grains.  These are some of recommendations for screening depending of age and risk factors:  - Vaccines:  Tdap vaccine every 10 years.  Shingles vaccine recommended at age 41, could be given after 63 years of age but not sure about insurance coverage.   Pneumonia vaccines: Pneumovax at 58. Sometimes Pneumovax is giving earlier if history of smoking, lung disease,diabetes,kidney disease among some.  Screening for diabetes at age 53 and every 3 years.  Cervical cancer prevention:  Pap smear starts at 63 years of age and continues periodically until 63 years old in low risk women. Pap smear  every 3 years between 9 and 84 years old. Pap smear every 3-5 years between women 55 and older if pap smear negative and HPV screening negative.   -Breast cancer: Mammogram: There is disagreement between experts about when to start screening in low risk asymptomatic female but recent recommendations are to start screening at 69 and not later than 63 years old , every 1-2 years and after 63 yo q 2 years. Screening is recommended until 63 years old but some women can continue screening depending of healthy issues.  Colon cancer screening: Has been recently changed to 63 yo. Insurance may not cover until you are 63 years old. Screening is recommended until 63 years old.  Cholesterol disorder screening at age 68 and every 3 years. N/A  Also recommended:  1. Dental visit- Brush and floss your teeth twice daily; visit your dentist twice a year. 2. Eye doctor- Get an eye exam at least every 2 years. 3. Helmet use- Always wear a helmet when riding a bicycle, motorcycle, rollerblading or skateboarding. 4. Safe sex- If you may be exposed to sexually transmitted infections, use a condom. 5. Seat belts- Seat belts can save your live; always wear one. 6. Smoke/Carbon Monoxide detectors- These detectors need to be installed on the appropriate level of your home. Replace batteries at least once a year. 7. Skin cancer- When out in the sun please cover up and use sunscreen 15 SPF  or higher. 8. Violence- If anyone is threatening or hurting you, please tell your healthcare provider.  9. Drink alcohol in moderation- Limit alcohol intake to one drink or less per day. Never drink and drive. 10. Calcium supplementation 1000 to 1200 mg daily, ideally through your diet.  Vitamin D supplementation 800 units daily.

## 2020-06-11 NOTE — Patient Instructions (Addendum)
Today you have you routine preventive visit. A few things to remember from today's visit:   Routine general medical examination at a health care facility  Essential hypertension - Plan: losartan (COZAAR) 50 MG tablet  Unknown status of immunity to COVID-19 virus - Plan: SARS-COV-2 IgG  Vitamin D deficiency, unspecified - Plan: VITAMIN D 25 Hydroxy (Vit-D Deficiency, Fractures)  Screening for endocrine, metabolic and immunity disorder - Plan: BASIC METABOLIC PANEL WITH GFR  Hyperlipidemia, unspecified hyperlipidemia type - Plan: Lipid panel  If you need refills please call your pharmacy. Do not use My Chart to request refills or for acute issues that need immediate attention.    Please be sure medication list is accurate. If a new problem present, please set up appointment sooner than planned today.  At least 150 minutes of moderate exercise per week, daily brisk walking for 15-30 min is a good exercise option. Healthy diet low in saturated (animal) fats and sweets and consisting of fresh fruits and vegetables, lean meats such as fish and white chicken and whole grains.  These are some of recommendations for screening depending of age and risk factors:  - Vaccines:  Tdap vaccine every 10 years.  Shingles vaccine recommended at age 55, could be given after 63 years of age but not sure about insurance coverage.   Pneumonia vaccines: Pneumovax at 61. Sometimes Pneumovax is giving earlier if history of smoking, lung disease,diabetes,kidney disease among some.  Screening for diabetes at age 13 and every 3 years.  Cervical cancer prevention:  Pap smear starts at 63 years of age and continues periodically until 63 years old in low risk women. Pap smear every 3 years between 36 and 42 years old. Pap smear every 3-5 years between women 72 and older if pap smear negative and HPV screening negative.   -Breast cancer: Mammogram: There is disagreement between experts about when to start  screening in low risk asymptomatic female but recent recommendations are to start screening at 59 and not later than 63 years old , every 1-2 years and after 63 yo q 2 years. Screening is recommended until 63 years old but some women can continue screening depending of healthy issues.  Colon cancer screening: Has been recently changed to 63 yo. Insurance may not cover until you are 63 years old. Screening is recommended until 63 years old.  Cholesterol disorder screening at age 26 and every 3 years. N/A  Also recommended:  1. Dental visit- Brush and floss your teeth twice daily; visit your dentist twice a year. 2. Eye doctor- Get an eye exam at least every 2 years. 3. Helmet use- Always wear a helmet when riding a bicycle, motorcycle, rollerblading or skateboarding. 4. Safe sex- If you may be exposed to sexually transmitted infections, use a condom. 5. Seat belts- Seat belts can save your live; always wear one. 6. Smoke/Carbon Monoxide detectors- These detectors need to be installed on the appropriate level of your home. Replace batteries at least once a year. 7. Skin cancer- When out in the sun please cover up and use sunscreen 15 SPF or higher. 8. Violence- If anyone is threatening or hurting you, please tell your healthcare provider.  9. Drink alcohol in moderation- Limit alcohol intake to one drink or less per day. Never drink and drive. 10. Calcium supplementation 1000 to 1200 mg daily, ideally through your diet.  Vitamin D supplementation 800 units daily.

## 2020-06-14 LAB — VITAMIN D 25 HYDROXY (VIT D DEFICIENCY, FRACTURES): Vit D, 25-Hydroxy: 33 ng/mL (ref 30–100)

## 2020-06-14 LAB — SARS-COV-2 ANTIBODY(IGG)SPIKE,SEMI-QUANTITATIVE: SARS COV1 AB(IGG)SPIKE,SEMI QN: <1 index (ref <1.00)

## 2020-07-12 ENCOUNTER — Encounter: Payer: Self-pay | Admitting: Family Medicine

## 2020-07-20 ENCOUNTER — Other Ambulatory Visit: Payer: Self-pay | Admitting: Family Medicine

## 2020-07-20 DIAGNOSIS — N3281 Overactive bladder: Secondary | ICD-10-CM

## 2020-07-20 MED ORDER — MIRABEGRON ER 25 MG PO TB24: 25.0000 mg | ORAL_TABLET | Freq: Every day | ORAL | 1 refills | Status: DC

## 2020-08-06 LAB — BASIC METABOLIC PANEL
BUN: 19 mg/dL (ref 7–25)
CO2: 32 mmol/L (ref 20–32)
Calcium: 9.6 mg/dL (ref 8.6–10.4)
Chloride: 104 mmol/L (ref 98–110)
Creat: 0.74 mg/dL (ref 0.50–0.99)
Glucose, Bld: 88 mg/dL (ref 65–99)
Potassium: 4.4 mmol/L (ref 3.5–5.3)
Sodium: 140 mmol/L (ref 135–146)

## 2020-08-06 LAB — LIPID PANEL
Cholesterol: 238 mg/dL — ABNORMAL HIGH (ref <200)
HDL: 57 mg/dL (ref >=50)
LDL Cholesterol (Calc): 159 mg/dL (calc) — ABNORMAL HIGH
Non-HDL Cholesterol (Calc): 181 mg/dL (calc) — ABNORMAL HIGH (ref <130)
Total CHOL/HDL Ratio: 4.2 (calc) (ref <5.0)
Triglycerides: 106 mg/dL (ref <150)

## 2020-08-20 ENCOUNTER — Encounter: Payer: Self-pay | Admitting: Family Medicine

## 2020-08-25 MED ORDER — LOVASTATIN 20 MG PO TABS: 20.0000 mg | ORAL_TABLET | Freq: Every day | ORAL | 3 refills | Status: DC

## 2020-09-01 ENCOUNTER — Other Ambulatory Visit: Payer: Self-pay

## 2020-09-01 DIAGNOSIS — N3281 Overactive bladder: Secondary | ICD-10-CM

## 2020-09-01 DIAGNOSIS — K219 Gastro-esophageal reflux disease without esophagitis: Secondary | ICD-10-CM

## 2020-09-01 DIAGNOSIS — I10 Essential (primary) hypertension: Secondary | ICD-10-CM

## 2020-09-01 MED ORDER — PANTOPRAZOLE SODIUM 20 MG PO TBEC: 20.0000 mg | DELAYED_RELEASE_TABLET | Freq: Every day | ORAL | 3 refills | Status: DC

## 2020-09-01 MED ORDER — LOVASTATIN 20 MG PO TABS: 20.0000 mg | ORAL_TABLET | Freq: Every day | ORAL | 3 refills | Status: DC

## 2020-09-01 MED ORDER — LOSARTAN POTASSIUM 50 MG PO TABS: 50.0000 mg | ORAL_TABLET | Freq: Every day | ORAL | 2 refills | Status: DC

## 2020-09-01 MED ORDER — MIRABEGRON ER 25 MG PO TB24: 25.0000 mg | ORAL_TABLET | Freq: Every day | ORAL | 1 refills | Status: DC

## 2020-09-17 ENCOUNTER — Other Ambulatory Visit: Payer: Self-pay

## 2020-09-17 ENCOUNTER — Encounter: Payer: Self-pay | Admitting: Obstetrics and Gynecology

## 2020-09-17 ENCOUNTER — Ambulatory Visit (INDEPENDENT_AMBULATORY_CARE_PROVIDER_SITE_OTHER): Payer: 59 | Admitting: Obstetrics and Gynecology

## 2020-09-17 VITALS — BP 162/98 | HR 72 | Resp 14 | Ht 67.0 in | Wt 163.5 lb

## 2020-09-17 DIAGNOSIS — Z01419 Encounter for gynecological examination (general) (routine) without abnormal findings: Secondary | ICD-10-CM

## 2020-09-17 NOTE — Progress Notes (Signed)
64 y.o. [id number] Married White or Caucasian Not Hispanic or Latino female here for annual exam.  She has some vaginal dryness with intercourse, helped with lubrication. No vaginal bleeding.   Moved to [facility] for husbands work.   On Myrbetriq for OAB. Only takes it every other day, which keeps it under control.   She was recently started on a statin.     No LMP recorded. Patient is postmenopausal.          Sexually active: Yes.    The current method of family planning is post-menopausal.    Exercising: Yes.    walking Smoker:  no  Health Maintenance: Pap:  01-22-18 negative, HR HPV negative  History of abnormal Pap:  no MMG:  06-11-20 density B/BIRADS 1 negative  BMD:   04-2008 normal  Colonoscopy: 03-2011 polyps, repeat 10 years  TDaP:  12-11-11 Gardasil: n/a   reports that she has never smoked. She has never used smokeless tobacco. She reports current alcohol use of about 2.0 - 3.0 standard drinks of alcohol per week. She reports that she does not use drugs.  She has 4 daughters and 5 grandchildren (one on the way).  Past Medical History:  Diagnosis Date  . Family history of polyps in the colon   . Fibroid   . Frequent headaches   . GERD (gastroesophageal reflux disease)   . Hay fever   . UTI (urinary tract infection)     Past Surgical History:  Procedure Laterality Date  .  ablation uterine      Current Outpatient Medications  Medication Sig Dispense Refill  . losartan (COZAAR) 50 MG tablet Take 1 tablet (50 mg total) by mouth daily. 90 tablet 2  . lovastatin (MEVACOR) 20 MG tablet Take 1 tablet (20 mg total) by mouth at bedtime. 30 tablet 3  . mirabegron ER (MYRBETRIQ) 25 MG TB24 tablet Take 1 tablet (25 mg total) by mouth daily. 30 tablet 1  . pantoprazole (PROTONIX) 20 MG tablet Take 1 tablet (20 mg total) by mouth daily. 90 tablet 3   No current facility-administered medications for this visit.    Family History  Problem Relation Age of Onset  . Early death Father   .  Hypertension Mother   . Breast cancer Mother        after menopause  . Hypertension Brother     Review of Systems  All other systems reviewed and are negative.   Exam:   BP (!) 162/98 (BP Location: Right Arm, Patient Position: Sitting, Cuff Size: Normal)   Pulse 72   Resp 14   Ht 5\' 7"  (1.702 m)   Wt 163 lb 8 oz (74.2 kg)   BMI 25.61 kg/m   Weight change: @WEIGHTCHANGE @ Height:   Height: 5\' 7"  (170.2 cm)  Ht Readings from Last 3 Encounters:  09/17/20 5\' 7"  (1.702 m)  06/11/20 5\' 7"  (1.702 m)  06/13/19 5\' 7"  (1.702 m)    General appearance: alert, cooperative and appears stated age Head: Normocephalic, without obvious abnormality, atraumatic Neck: no adenopathy, supple, symmetrical, trachea midline and thyroid normal to inspection and palpation Lungs: clear to auscultation bilaterally Cardiovascular: regular rate and rhythm Breasts: normal appearance, no masses or tenderness Abdomen: soft, non-tender; non distended,  no masses,  no organomegaly Extremities: extremities normal, atraumatic, no cyanosis or edema Skin: Skin color, texture, turgor normal. No rashes or lesions Lymph nodes: Cervical, supraclavicular, and axillary nodes normal. No abnormal inguinal nodes palpated Neurologic: Grossly normal   Pelvic:  External genitalia:  no lesions              Urethra:  normal appearing urethra with no masses, tenderness or lesions              Bartholins and Skenes: normal                 Vagina: normal appearing vagina with normal color and discharge, no lesions              Cervix: no lesions               Bimanual Exam:  Uterus:  normal size, contour, position, consistency, mobility, non-tender              Adnexa: no mass, fullness, tenderness               Rectovaginal: Confirms               Anus:  normal sphincter tone, no lesions  Karmen Bongo chaperoned for the exam.  A:  Well Woman with normal exam  P:   No pap this year  Discussed breast self  exam  Discussed calcium and vit D intake  Mammogram UTD  Colonoscopy due in 6/22

## 2020-09-17 NOTE — Patient Instructions (Signed)
EXERCISE   We recommended that you start or continue a regular exercise program for good health. Physical activity is anything that gets your body moving, some is better than none. The CDC recommends 150 minutes per week of Moderate-Intensity Aerobic Activity and 2 or more days of Muscle Strengthening Activity.  Benefits of exercise are limitless: helps weight loss/weight maintenance, improves mood and energy, helps with depression and anxiety, improves sleep, tones and strengthens muscles, improves balance, improves bone density, protects from chronic conditions such as heart disease, high blood pressure and diabetes and so much more. To learn more visit: https://www.cdc.gov/physicalactivity/index.html  DIET: Good nutrition starts with a healthy diet of fruits, vegetables, whole grains, and lean protein sources. Drink plenty of water for hydration. Minimize empty calories, sodium, sweets. For more information about dietary recommendations visit: https://health.gov/our-work/nutrition-physical-activity/dietary-guidelines and https://www.myplate.gov/  ALCOHOL:  Women should limit their alcohol intake to no more than 7 drinks/beers/glasses of wine (combined, not each!) per week. Moderation of alcohol intake to this level decreases your risk of breast cancer and liver damage.  If you are concerned that you may have a problem, or your friends have told you they are concerned about your drinking, there are many resources to help. A well-known program that is free, effective, and available to all people all over the nation is Alcoholics Anonymous.  Check out this site to learn more: https://www.aa.org/   CALCIUM AND VITAMIN D:  Adequate intake of calcium and Vitamin D are recommended for bone health.  The recommendations for exact amounts of these supplements seem to change often, but generally speaking 1000-1500 mg of calcium (between diet and supplement) and 800 units of Vitamin D per day seems prudent.      PAP SMEARS:  Pap smears, to check for cervical cancer or precancers,  have traditionally been done yearly, although recent scientific advances have shown that most women can have pap smears less often.  However, every woman still should have a physical exam from her gynecologist every year. It will include a breast check, inspection of the vulva and vagina to check for abnormal growths or skin changes, a visual exam of the cervix, and then an exam to evaluate the size and shape of the uterus and ovaries.  And after 63 years of age, a rectal exam is indicated to check for rectal cancers. We will also provide age appropriate advice regarding health maintenance, like when you should have certain vaccines, screening for sexually transmitted diseases, bone density testing, colonoscopy, mammograms, etc.   MAMMOGRAMS:  All women over 40 years old should have a routine mammogram.   COLON CANCER SCREENING: Now recommend starting at age 45. At this time colonoscopy is not covered for routine screening until 50. There are take home tests that can be done between 45-49.   COLONOSCOPY:  Colonoscopy to screen for colon cancer is recommended for all women at age 50.  We know, you hate the idea of the prep.  We agree, BUT, having colon cancer and not knowing it is worse!!  Colon cancer so often starts as a polyp that can be seen and removed at colonscopy, which can quite literally save your life!  And if your first colonoscopy is normal and you have no family history of colon cancer, most women don't have to have it again for 10 years.  Once every ten years, you can do something that may end up saving your life, right?  We will be happy to help you get it scheduled when   you are ready.  Be sure to check your insurance coverage so you understand how much it will cost.  It may be covered as a preventative service at no cost, but you should check your particular policy.      Breast Self-Awareness Breast self-awareness  means being familiar with how your breasts look and feel. It involves checking your breasts regularly and reporting any changes to your health care provider. Practicing breast self-awareness is important. A change in your breasts can be a sign of a serious medical problem. Being familiar with how your breasts look and feel allows you to find any problems early, when treatment is more likely to be successful. All women should practice breast self-awareness, including women who have had breast implants. How to do a breast self-exam One way to learn what is normal for your breasts and whether your breasts are changing is to do a breast self-exam. To do a breast self-exam: Look for Changes  1. Remove all the clothing above your waist. 2. Stand in front of a mirror in a room with good lighting. 3. Put your hands on your hips. 4. Push your hands firmly downward. 5. Compare your breasts in the mirror. Look for differences between them (asymmetry), such as: ? Differences in shape. ? Differences in size. ? Puckers, dips, and bumps in one breast and not the other. 6. Look at each breast for changes in your skin, such as: ? Redness. ? Scaly areas. 7. Look for changes in your nipples, such as: ? Discharge. ? Bleeding. ? Dimpling. ? Redness. ? A change in position. Feel for Changes Carefully feel your breasts for lumps and changes. It is best to do this while lying on your back on the floor and again while sitting or standing in the shower or tub with soapy water on your skin. Feel each breast in the following way:  Place the arm on the side of the breast you are examining above your head.  Feel your breast with the other hand.  Start in the nipple area and make  inch (2 cm) overlapping circles to feel your breast. Use the pads of your three middle fingers to do this. Apply light pressure, then medium pressure, then firm pressure. The light pressure will allow you to feel the tissue closest to the  skin. The medium pressure will allow you to feel the tissue that is a little deeper. The firm pressure will allow you to feel the tissue close to the ribs.  Continue the overlapping circles, moving downward over the breast until you feel your ribs below your breast.  Move one finger-width toward the center of the body. Continue to use the  inch (2 cm) overlapping circles to feel your breast as you move slowly up toward your collarbone.  Continue the up and down exam using all three pressures until you reach your armpit.  Write Down What You Find  Write down what is normal for each breast and any changes that you find. Keep a written record with breast changes or normal findings for each breast. By writing this information down, you do not need to depend only on memory for size, tenderness, or location. Write down where you are in your menstrual cycle, if you are still menstruating. If you are having trouble noticing differences in your breasts, do not get discouraged. With time you will become more familiar with the variations in your breasts and more comfortable with the exam. How often should I examine   my breasts? Examine your breasts every month. If you are breastfeeding, the best time to examine your breasts is after a feeding or after using a breast pump. If you menstruate, the best time to examine your breasts is 5-7 days after your period is over. During your period, your breasts are lumpier, and it may be more difficult to notice changes. When should I see my health care provider? See your health care provider if you notice:  A change in shape or size of your breasts or nipples.  A change in the skin of your breast or nipples, such as a reddened or scaly area.  Unusual discharge from your nipples.  A lump or thick area that was not there before.  Pain in your breasts.  Anything that concerns you.  

## 2020-09-21 ENCOUNTER — Other Ambulatory Visit: Payer: Self-pay

## 2020-09-21 MED ORDER — LOVASTATIN 20 MG PO TABS: 20.0000 mg | ORAL_TABLET | Freq: Every day | ORAL | 3 refills | Status: DC

## 2020-10-18 ENCOUNTER — Other Ambulatory Visit: Payer: Self-pay | Admitting: Family Medicine

## 2020-10-18 DIAGNOSIS — N3281 Overactive bladder: Secondary | ICD-10-CM

## 2020-12-13 ENCOUNTER — Telehealth: Payer: 59 | Admitting: Family Medicine

## 2021-06-27 ENCOUNTER — Other Ambulatory Visit: Payer: Self-pay | Admitting: Family Medicine

## 2021-06-27 DIAGNOSIS — I10 Essential (primary) hypertension: Secondary | ICD-10-CM

## 2021-06-28 ENCOUNTER — Other Ambulatory Visit: Payer: Self-pay | Admitting: Family Medicine

## 2021-06-28 DIAGNOSIS — K219 Gastro-esophageal reflux disease without esophagitis: Secondary | ICD-10-CM

## 2021-07-30 IMAGING — MG DIGITAL SCREENING BILAT W/ TOMO W/ CAD
8 series · 8 of 24 positions shown · non-contrast
Comparison: Previous exam(s).

CLINICAL DATA: Screening.

EXAM:
DIGITAL SCREENING BILATERAL MAMMOGRAM WITH TOMO AND CAD

[L CC synth-2D]
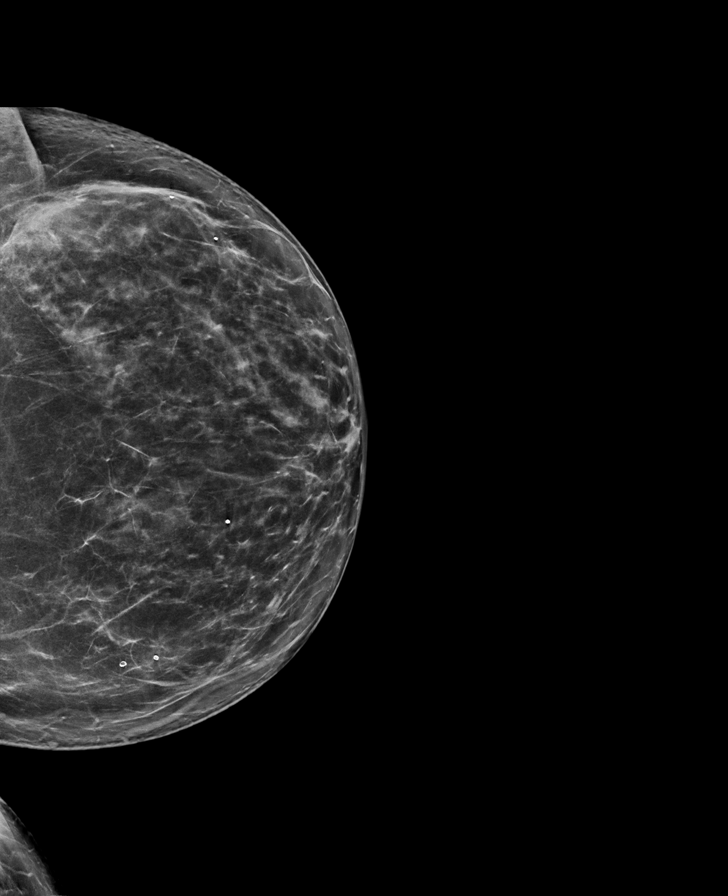

[R MLO synth-2D]
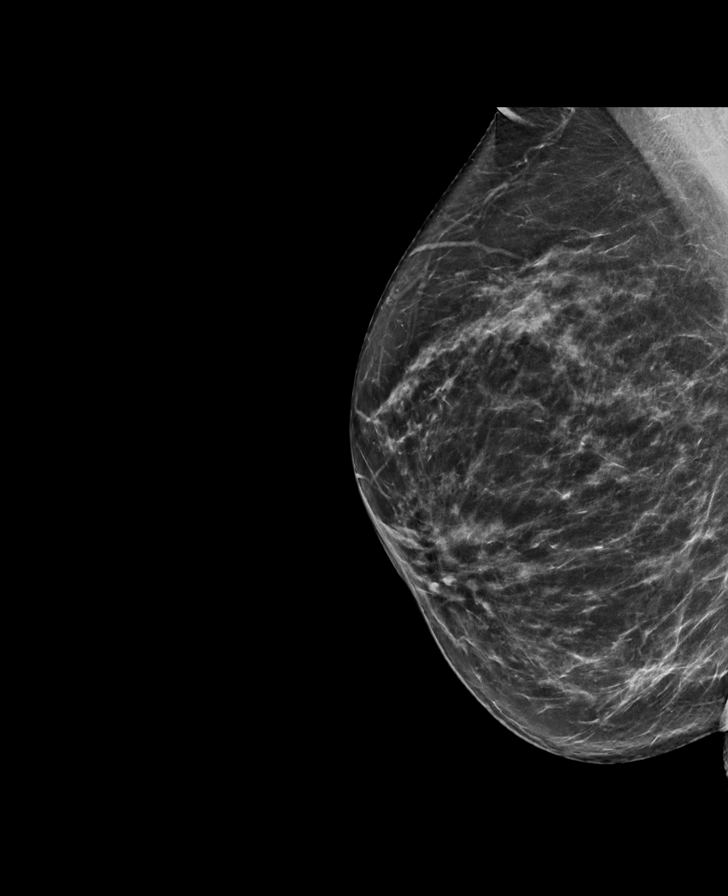

[R CC synth-2D]
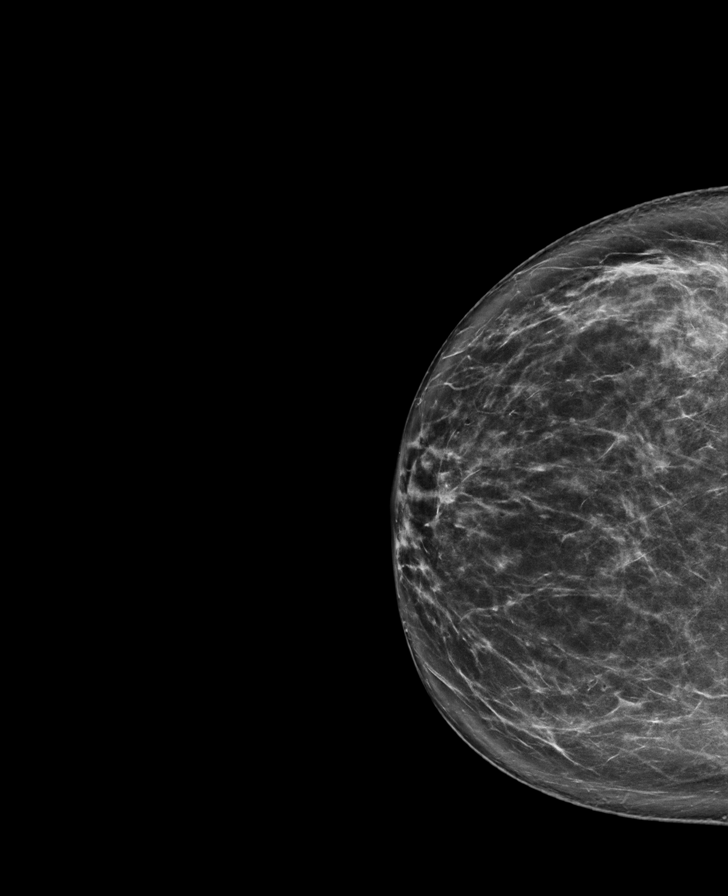

[L MLO synth-2D]
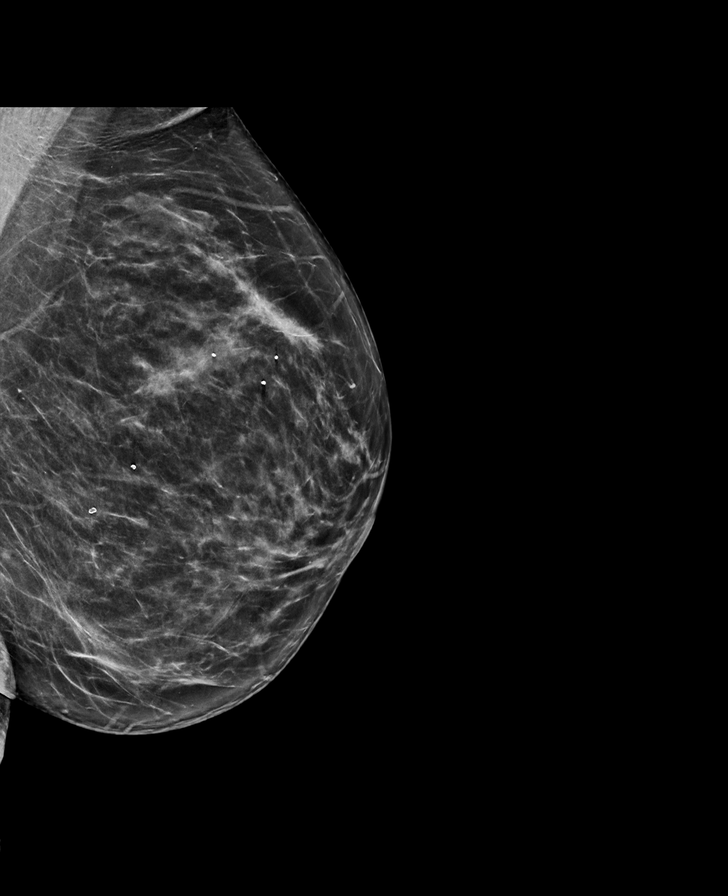

[L MLO tomo · tomo slice 39/77.0]
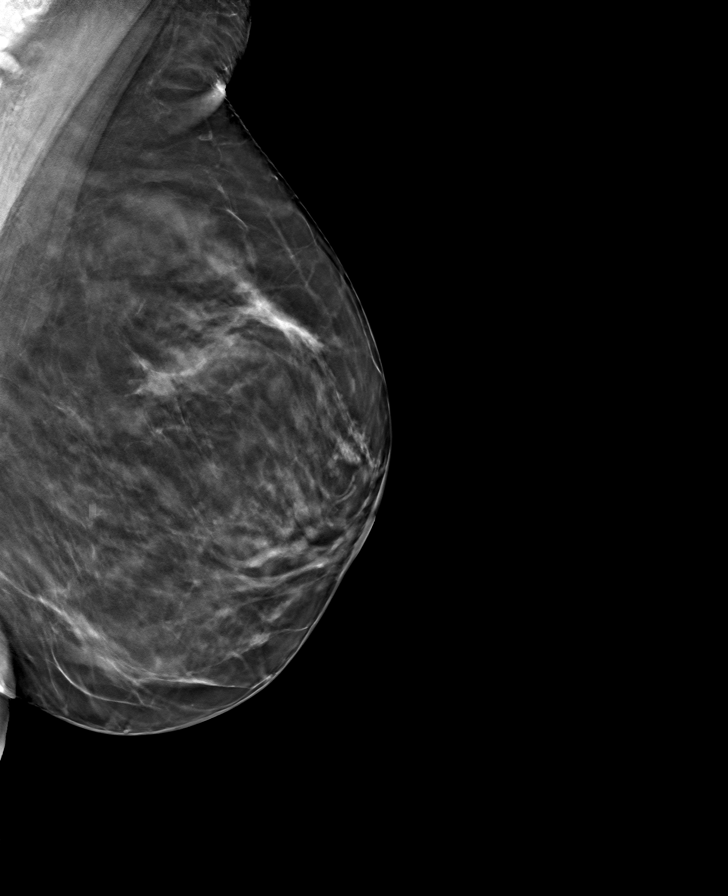

[R MLO tomo · tomo slice 41/82.0]
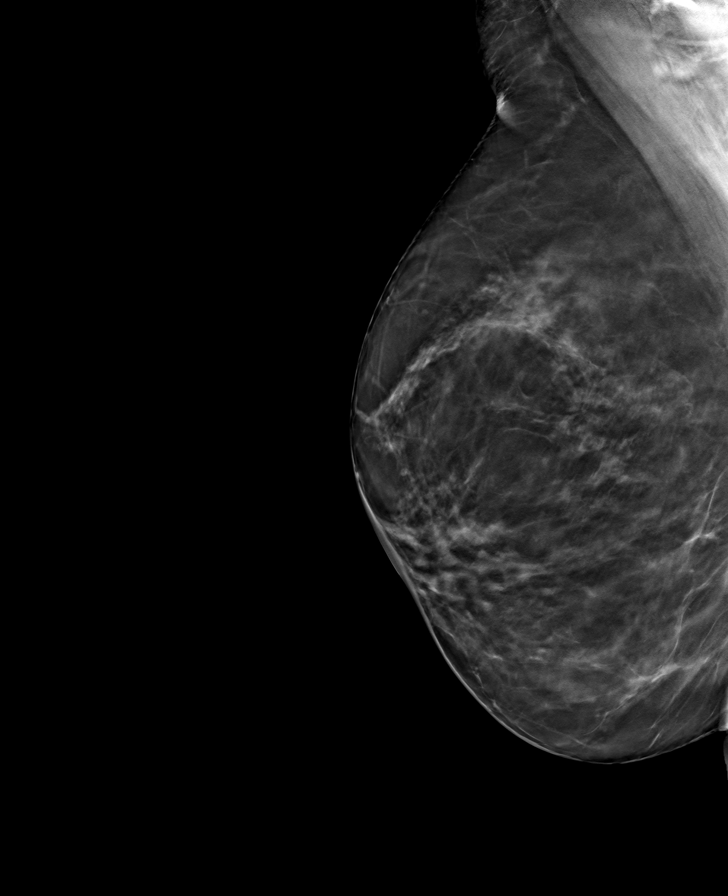

[R CC tomo · tomo slice 42/83.0]
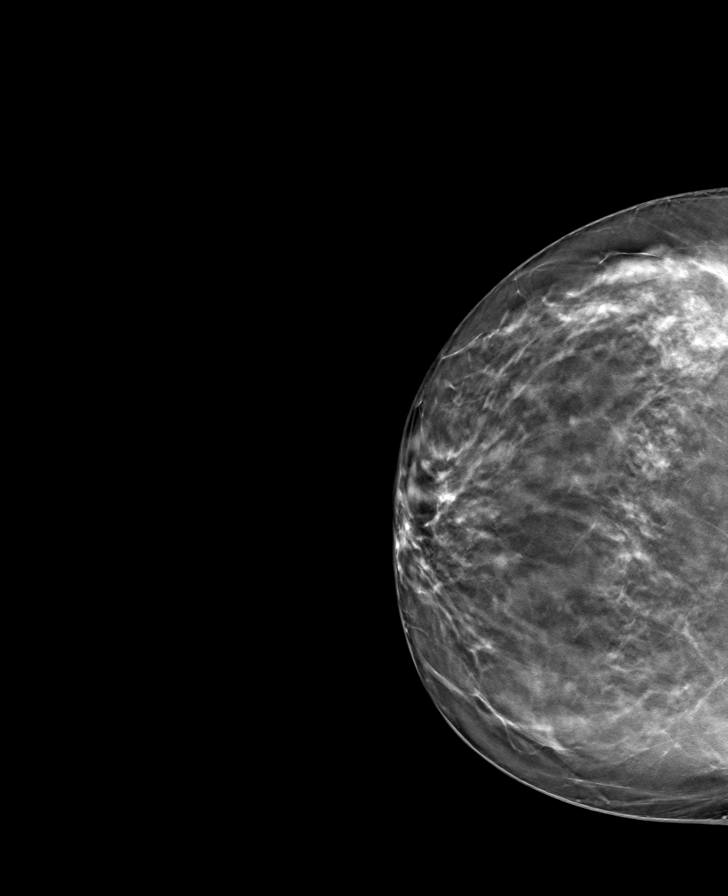

[L CC tomo · tomo slice 41/81.0]
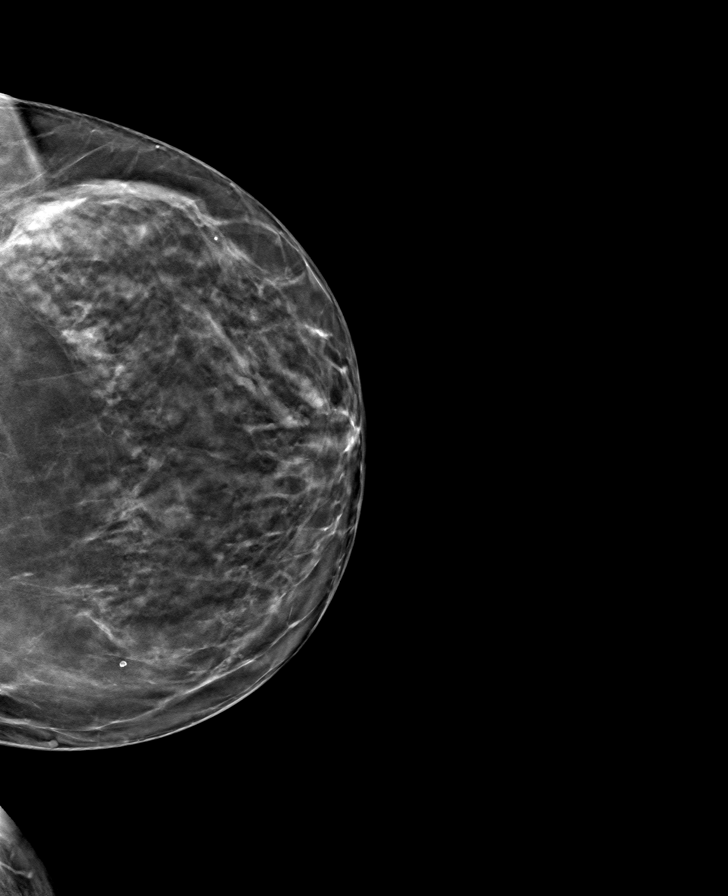

[8 of 24 positions shown; findings below may reference images not displayed]

ACR Breast Density Category b: There are scattered areas of
fibroglandular density.
FINDINGS: There are no findings suspicious for malignancy. Images were
processed with CAD.
IMPRESSION: No mammographic evidence of malignancy. A result letter of this
screening mammogram will be mailed directly to the patient.

RECOMMENDATION:
Screening mammogram in one year. (Code:[id number])

BI-RADS CATEGORY  1: Negative.

## 2021-09-03 ENCOUNTER — Other Ambulatory Visit: Payer: Self-pay | Admitting: Family Medicine

## 2021-09-20 NOTE — Progress Notes (Deleted)
65 y.o. O2V0350 Married White or Caucasian Not Hispanic or Latino female here for annual exam.      No LMP recorded. Patient is postmenopausal.          Sexually active: {yes no:314532}  The current method of family planning is {contraception:315051}.    Exercising: {yes no:314532}  {types:19826} Smoker:  {YES NO:22349}  Health Maintenance: Pap:  01-22-18 negative, HR HPV negative  History of abnormal Pap:  no MMG:  06/15/20 density B Bi-rads 1 neg  BMD:   04/2008 normal  Colonoscopy: 03/2011 polyps f/u 10 years  TDaP:  12/01/11  Gardasil: n/a   reports that she has never smoked. She has never used smokeless tobacco. She reports current alcohol use of about 2.0 - 3.0 standard drinks per week. She reports that she does not use drugs.  Past Medical History:  Diagnosis Date   Family history of polyps in the colon    Fibroid    Frequent headaches    GERD (gastroesophageal reflux disease)    Hay fever    UTI (urinary tract infection)     Past Surgical History:  Procedure Laterality Date    ablation uterine      Current Outpatient Medications  Medication Sig Dispense Refill   losartan (COZAAR) 50 MG tablet TAKE 1 TABLET BY MOUTH EVERY DAY 90 tablet 0   lovastatin (MEVACOR) 20 MG tablet TAKE 1 TABLET AT BEDTIME 90 tablet 0   MYRBETRIQ 25 MG TB24 tablet TAKE 1 TABLET DAILY 30 tablet 1   pantoprazole (PROTONIX) 20 MG tablet TAKE 1 TABLET BY MOUTH EVERY DAY 90 tablet 0   No current facility-administered medications for this visit.    Family History  Problem Relation Age of Onset   Early death Father    Hypertension Mother    Breast cancer Mother        after menopause   Hypertension Brother     Review of Systems  Exam:   There were no vitals taken for this visit.  Weight change: @WEIGHTCHANGE @ Height:      Ht Readings from Last 3 Encounters:  09/17/20 5\' 7"  (1.702 m)  06/11/20 5\' 7"  (1.702 m)  06/13/19 5\' 7"  (1.702 m)    General appearance: alert, cooperative and  appears stated age Head: Normocephalic, without obvious abnormality, atraumatic Neck: no adenopathy, supple, symmetrical, trachea midline and thyroid {CHL AMB PHY EX THYROID NORM [phone]::"normal to inspection and palpation"} Lungs: clear to auscultation bilaterally Cardiovascular: regular rate and rhythm Breasts: {Exam; breast:13139::"normal appearance, no masses or tenderness"} Abdomen: soft, non-tender; non distended,  no masses,  no organomegaly Extremities: extremities normal, atraumatic, no cyanosis or edema Skin: Skin color, texture, turgor normal. No rashes or lesions Lymph nodes: Cervical, supraclavicular, and axillary nodes normal. No abnormal inguinal nodes palpated Neurologic: Grossly normal   Pelvic: External genitalia:  no lesions              Urethra:  normal appearing urethra with no masses, tenderness or lesions              Bartholins and Skenes: normal                 Vagina: normal appearing vagina with normal color and discharge, no lesions              Cervix: {CHL AMB PHY EX CERVIX NORM [phone]::"no lesions"}               Bimanual Exam:  Uterus:  {CHL AMB  PHY EX UTERUS NORM DEFAULT:[phone]::"normal size, contour, position, consistency, mobility, non-tender"}              Adnexa: {CHL AMB PHY EX ADNEXA NO MASS DEFAULT:[phone]::"no mass, fullness, tenderness"}               Rectovaginal: Confirms               Anus:  normal sphincter tone, no lesions  *** chaperoned for the exam.  A:  Well Woman with normal exam  P:

## 2021-09-22 ENCOUNTER — Ambulatory Visit: Payer: 59 | Admitting: Obstetrics and Gynecology

## 2021-10-23 ENCOUNTER — Other Ambulatory Visit: Payer: Self-pay | Admitting: Family Medicine

## 2021-10-23 DIAGNOSIS — I10 Essential (primary) hypertension: Secondary | ICD-10-CM

## 2021-10-29 ENCOUNTER — Other Ambulatory Visit: Payer: Self-pay | Admitting: Family Medicine

## 2021-10-29 DIAGNOSIS — K219 Gastro-esophageal reflux disease without esophagitis: Secondary | ICD-10-CM

## 2022-01-26 ENCOUNTER — Other Ambulatory Visit: Payer: Self-pay | Admitting: Family Medicine

## 2022-02-21 ENCOUNTER — Other Ambulatory Visit: Payer: Self-pay | Admitting: Family Medicine
